# Patient Record
Sex: Female | Born: 1955 | Race: White | Hispanic: No | Marital: Married | Smoking: Never smoker
Health system: Southern US, Community
[De-identification: ages and names within clinical notes are randomized; demographics above are authoritative.]

## PROBLEM LIST (undated history)

## (undated) DIAGNOSIS — T7840XA Allergy, unspecified, initial encounter: Secondary | ICD-10-CM

## (undated) DIAGNOSIS — E119 Type 2 diabetes mellitus without complications: Secondary | ICD-10-CM

## (undated) DIAGNOSIS — I10 Essential (primary) hypertension: Secondary | ICD-10-CM

## (undated) DIAGNOSIS — J302 Other seasonal allergic rhinitis: Secondary | ICD-10-CM

## (undated) DIAGNOSIS — E785 Hyperlipidemia, unspecified: Secondary | ICD-10-CM

## (undated) HISTORY — DX: Hyperlipidemia, unspecified: E78.5

## (undated) HISTORY — PX: BREAST LUMPECTOMY: SHX2

## (undated) HISTORY — PX: UTERINE FIBROID SURGERY: SHX826

## (undated) HISTORY — PX: BREAST CYST ASPIRATION: SHX578

## (undated) HISTORY — PX: BREAST BIOPSY: SHX20

## (undated) HISTORY — DX: Other seasonal allergic rhinitis: J30.2

## (undated) HISTORY — PX: TONSILLECTOMY: SUR1361

## (undated) HISTORY — DX: Allergy, unspecified, initial encounter: T78.40XA

## (undated) HISTORY — PX: BREAST EXCISIONAL BIOPSY: SUR124

---

## 2017-01-25 ENCOUNTER — Encounter: Payer: Self-pay | Admitting: Primary Care

## 2017-01-25 ENCOUNTER — Ambulatory Visit (INDEPENDENT_AMBULATORY_CARE_PROVIDER_SITE_OTHER): Payer: 59 | Admitting: Primary Care

## 2017-01-25 ENCOUNTER — Other Ambulatory Visit (HOSPITAL_COMMUNITY)
Admission: RE | Admit: 2017-01-25 | Discharge: 2017-01-25 | Disposition: A | Discharge status: Home / Self Care | Payer: 59 | Source: Ambulatory Visit | Attending: Primary Care | Admitting: Primary Care

## 2017-01-25 VITALS — BP 146/94 | HR 100 | Temp 98.2°F | Ht 63.0 in | Wt 200.0 lb

## 2017-01-25 DIAGNOSIS — Z01419 Encounter for gynecological examination (general) (routine) without abnormal findings: Secondary | ICD-10-CM | POA: Insufficient documentation

## 2017-01-25 DIAGNOSIS — Z1231 Encounter for screening mammogram for malignant neoplasm of breast: Secondary | ICD-10-CM | POA: Diagnosis not present

## 2017-01-25 DIAGNOSIS — E1165 Type 2 diabetes mellitus with hyperglycemia: Secondary | ICD-10-CM | POA: Insufficient documentation

## 2017-01-25 DIAGNOSIS — E119 Type 2 diabetes mellitus without complications: Secondary | ICD-10-CM | POA: Insufficient documentation

## 2017-01-25 DIAGNOSIS — R739 Hyperglycemia, unspecified: Secondary | ICD-10-CM

## 2017-01-25 DIAGNOSIS — Z1151 Encounter for screening for human papillomavirus (HPV): Secondary | ICD-10-CM | POA: Insufficient documentation

## 2017-01-25 DIAGNOSIS — E785 Hyperlipidemia, unspecified: Secondary | ICD-10-CM | POA: Diagnosis not present

## 2017-01-25 DIAGNOSIS — Z1211 Encounter for screening for malignant neoplasm of colon: Secondary | ICD-10-CM

## 2017-01-25 DIAGNOSIS — Z1239 Encounter for other screening for malignant neoplasm of breast: Secondary | ICD-10-CM

## 2017-01-25 DIAGNOSIS — R030 Elevated blood-pressure reading, without diagnosis of hypertension: Secondary | ICD-10-CM

## 2017-01-25 DIAGNOSIS — I10 Essential (primary) hypertension: Secondary | ICD-10-CM | POA: Insufficient documentation

## 2017-01-25 DIAGNOSIS — Z124 Encounter for screening for malignant neoplasm of cervix: Secondary | ICD-10-CM

## 2017-01-25 DIAGNOSIS — Z0000 Encounter for general adult medical examination without abnormal findings: Secondary | ICD-10-CM

## 2017-01-25 NOTE — Progress Notes (Addendum)
Subjective:    Patient ID: Melissa Baldwin, female    DOB: [birth year], 61 y.o.   MRN: [id number]  HPI  Melissa Baldwin is a 61 year old female who presents today to establish care and for complete physical. Will obtain old records. Her last physical was 2 years ago.   1) Elevated Blood Pressure Reading: BP of 146/94 today in the office. She does have a history of intermittent elevated readings. She does not exercise and does not eat a healthy diet. She does not check her BP regularly, her last check was 1 year ago.    Immunizations: -Tetanus: Completed 5 years ago.  -Influenza: Declines -Shingles: Declines  Diet: She endorses a fair diet. Breakfast: Oatmeal, cereal Lunch: Soup, sandwich  Dinner: Meat, soups, salads, vegetables Snacks: None Desserts: Occasionally Beverages: Water, orange juice, sweet tea, occasional soda  Exercise: She does not currently exercise.  Eye exam: Completed 3-4 years ago. Dental exam: Completes semi-annually, due. Colonoscopy: Completed 7 years ago. Due now. Pap Smear: Completed 3 years ago.  Mammogram: Completed 2 years ago.   Review of Systems  Constitutional: Negative for unexpected weight change.  HENT: Negative for rhinorrhea.   Respiratory: Negative for cough and shortness of breath.   Cardiovascular: Negative for chest pain.  Gastrointestinal: Negative for constipation and diarrhea.  Genitourinary: Negative for difficulty urinating and menstrual problem.  Musculoskeletal: Negative for arthralgias and myalgias.  Skin: Negative for rash.  Allergic/Immunologic: Negative for environmental allergies.  Neurological: Negative for dizziness, numbness and headaches.  Psychiatric/Behavioral:       Denies concerns for anxiety or depression       Past Medical History:  Diagnosis Date  . Hyperlipidemia   . Seasonal allergies      Social History   Social History  . Marital status: Married    Spouse name: N/A  . Number of children: N/A    . Years of education: N/A   Occupational History  . Not on file.   Social History Main Topics  . Smoking status: Never Smoker  . Smokeless tobacco: Never Used  . Alcohol use Yes  . Drug use: Unknown  . Sexual activity: Not on file   Other Topics Concern  . Not on file   Social History Narrative   Married.   1 child.   Moved from [facility]   Works as a Art therapist.    Past Surgical History:  Procedure Laterality Date  . BREAST LUMPECTOMY     fibrocystic  . UTERINE FIBROID SURGERY      Family History  Problem Relation Age of Onset  . Alzheimer's disease Mother   . Diabetes Paternal Grandfather     Allergies  Allergen Reactions  . Penicillins     No current outpatient prescriptions on file prior to visit.   No current facility-administered medications on file prior to visit.     BP (!) 146/94   Pulse 100   Temp 98.2 F (36.8 C) (Oral)   Ht 5\' 3"  (1.6 m)   Wt 200 lb (90.7 kg)   SpO2 99%   BMI 35.43 kg/m    Objective:   Physical Exam  Constitutional: She is oriented to person, place, and time. She appears well-nourished.  HENT:  Right Ear: Tympanic membrane and ear canal normal.  Left Ear: Tympanic membrane and ear canal normal.  Nose: Nose normal.  Mouth/Throat: Oropharynx is clear and moist.  Eyes: Conjunctivae and EOM are normal. Pupils are equal, round, and reactive to light.  Neck: Neck supple. No thyromegaly present.  Cardiovascular: Normal rate and regular rhythm.   No murmur heard. Pulmonary/Chest: Effort normal and breath sounds normal. She has no rales.  Abdominal: Soft. Bowel sounds are normal. There is no tenderness.  Genitourinary: There is no tenderness or lesion on the right labia. There is no tenderness or lesion on the left labia. Cervix exhibits no motion tenderness and no discharge. Right adnexum displays no tenderness. Left adnexum displays no tenderness. No vaginal discharge found.  Musculoskeletal: Normal range of motion.   Lymphadenopathy:    She has no cervical adenopathy.  Neurological: She is alert and oriented to person, place, and time. She has normal reflexes. No cranial nerve deficit.  Skin: Skin is warm and dry. No rash noted.  Psychiatric: She has a normal mood and affect.          Assessment & Plan:

## 2017-01-25 NOTE — Patient Instructions (Addendum)
We will notify you of your Pap results once received.  Schedule a lab only appointment to return fasting for your labs. We will receive those results 24 hours after collected and will call with the results.  It's important to improve your diet by reducing consumption, processed snack foods, sugary drinks. Increase consumption of fresh vegetables and fruits, whole grains, water.  Ensure you are drinking 64 ounces of water daily.  Start exercising. You should be getting 150 minutes of moderate intensity exercise weekly.  Call the [facility] to schedule your mammogram.  You will be contacted regarding your referral to GI for the colonoscopy.  Please let us know if you have not heard back within one week.   Follow up in 1 year for your annual exam or sooner if needed.  It was a pleasure to meet you today! Please don't hesitate to call me with any questions. Welcome to [facility]!

## 2017-01-25 NOTE — Progress Notes (Signed)
Pre visit review using our clinic review tool, if applicable. No additional management support is needed unless otherwise documented below in the visit note. 

## 2017-01-25 NOTE — Assessment & Plan Note (Signed)
History of, once managed on Zocor. Check lipids today.  Discussed the importance of a healthy diet and regular exercise in order for weight loss, and to reduce the risk of other medical diseases.

## 2017-01-25 NOTE — Assessment & Plan Note (Signed)
Tetanus UTD, declines influenza and Zostavax. Pap due, completed and pending. Mammogram due, ordered. Colonoscopy due, ordered. Exam unremarkable. Labs pending. Discussed the importance of a healthy diet and regular exercise in order for weight loss, and to reduce the risk of other medical diseases. Follow up in 1 year for annual exam.

## 2017-01-25 NOTE — Assessment & Plan Note (Signed)
Noted on labs from prior PCP visit. Check A1C. Discussed to work on diet and exercise.

## 2017-01-25 NOTE — Assessment & Plan Note (Addendum)
Slightly above goal today, also history of elevated readings. Will have her monitor BP and notify if above 140/90. Work on [employer], healthy lifestyle changes.

## 2017-01-26 LAB — CYTOLOGY - PAP
DIAGNOSIS: NEGATIVE
HPV: NOT DETECTED

## 2017-01-27 ENCOUNTER — Other Ambulatory Visit: Payer: 59

## 2017-01-27 ENCOUNTER — Encounter: Payer: Self-pay | Admitting: *Deleted

## 2017-02-08 ENCOUNTER — Other Ambulatory Visit: Payer: 59

## 2017-02-09 ENCOUNTER — Other Ambulatory Visit: Payer: Self-pay

## 2017-02-09 ENCOUNTER — Telehealth: Payer: Self-pay

## 2017-02-09 DIAGNOSIS — Z1211 Encounter for screening for malignant neoplasm of colon: Secondary | ICD-10-CM

## 2017-02-09 NOTE — Telephone Encounter (Signed)
Gastroenterology Pre-Procedure Review  Request Date: 02/21/17 Requesting Physician: Dr. Vicente Males  PATIENT REVIEW QUESTIONS: The patient responded to the following health history questions as indicated:    1. Are you having any GI issues? no 2. Do you have a personal history of Polyps? no 3. Do you have a family history of Colon Cancer or Polyps? no 4. Diabetes Mellitus? no 5. Joint replacements in the past 12 months?no 6. Major health problems in the past 3 months?no 7. Any artificial heart valves, MVP, or defibrillator?no    MEDICATIONS & ALLERGIES:    Patient reports the following regarding taking any anticoagulation/antiplatelet therapy:   Plavix, Coumadin, Eliquis, Xarelto, Lovenox, Pradaxa, Brilinta, or Effient? no Aspirin? no  Patient confirms/reports the following medications:  No current outpatient prescriptions on file.   No current facility-administered medications for this visit.     Patient confirms/reports the following allergies:  Allergies  Allergen Reactions  . Penicillins     No orders of the defined types were placed in this encounter.   AUTHORIZATION INFORMATION Primary Insurance: 1D#: Group #:  Secondary Insurance: 1D#: Group #:  SCHEDULE INFORMATION: Date: 02/21/17 Time: Location: [place]

## 2017-02-13 ENCOUNTER — Other Ambulatory Visit (INDEPENDENT_AMBULATORY_CARE_PROVIDER_SITE_OTHER): Payer: 59

## 2017-02-13 DIAGNOSIS — R739 Hyperglycemia, unspecified: Secondary | ICD-10-CM

## 2017-02-13 DIAGNOSIS — E785 Hyperlipidemia, unspecified: Secondary | ICD-10-CM | POA: Diagnosis not present

## 2017-02-13 LAB — COMPREHENSIVE METABOLIC PANEL
ALT: 56 U/L — ABNORMAL HIGH (ref 0–35)
AST: 30 U/L (ref 0–37)
Albumin: 4.4 g/dL (ref 3.5–5.2)
Alkaline Phosphatase: 83 U/L (ref 39–117)
BILIRUBIN TOTAL: 0.7 mg/dL (ref 0.2–1.2)
BUN: 13 mg/dL (ref 6–23)
CALCIUM: 9.9 mg/dL (ref 8.4–10.5)
CHLORIDE: 101 meq/L (ref 96–112)
CO2: 28 meq/L (ref 19–32)
Creatinine, Ser: 0.68 mg/dL (ref 0.40–1.20)
GFR: 93.58 mL/min (ref >60.00)
Glucose, Bld: 187 mg/dL — ABNORMAL HIGH (ref 70–99)
POTASSIUM: 4.4 meq/L (ref 3.5–5.1)
Sodium: 138 mEq/L (ref 135–145)
Total Protein: 7.6 g/dL (ref 6.0–8.3)

## 2017-02-13 LAB — HEMOGLOBIN A1C: Hgb A1c MFr Bld: 8.2 % — ABNORMAL HIGH (ref 4.6–6.5)

## 2017-02-13 LAB — LIPID PANEL
CHOL/HDL RATIO: 6
Cholesterol: 322 mg/dL — ABNORMAL HIGH (ref 0–200)
HDL: 56.2 mg/dL (ref >39.00)
NONHDL: 265.89
TRIGLYCERIDES: 250 mg/dL — AB (ref 0.0–149.0)
VLDL: 50 mg/dL — AB (ref 0.0–40.0)

## 2017-02-13 LAB — LDL CHOLESTEROL, DIRECT: LDL DIRECT: 210 mg/dL

## 2017-02-14 ENCOUNTER — Telehealth: Payer: Self-pay | Admitting: Gastroenterology

## 2017-02-14 NOTE — Telephone Encounter (Signed)
Patient called to cancel her colonoscopy due to helping parents out of state. She will call us back to reschedule. I called [facility] to let them know.

## 2017-02-15 ENCOUNTER — Other Ambulatory Visit: Payer: Self-pay | Admitting: Primary Care

## 2017-02-15 DIAGNOSIS — E119 Type 2 diabetes mellitus without complications: Secondary | ICD-10-CM

## 2017-02-15 DIAGNOSIS — E785 Hyperlipidemia, unspecified: Secondary | ICD-10-CM

## 2017-02-15 MED ORDER — ATORVASTATIN CALCIUM 40 MG PO TABS: 40.0000 mg | ORAL_TABLET | Freq: Every evening | ORAL | 1 refills | Status: DC

## 2017-02-15 MED ORDER — METFORMIN HCL 500 MG PO TABS: ORAL_TABLET | ORAL | 0 refills | Status: DC

## 2017-02-16 ENCOUNTER — Telehealth: Payer: Self-pay

## 2017-02-16 NOTE — Telephone Encounter (Signed)
Okay to delay treatment for two weeks, would recommend she start as soon as she gets back. No interaction with Metformin, atorvastatin, Claritin.

## 2017-02-16 NOTE — Telephone Encounter (Signed)
Pt will be flying for 2 hours this weekend and will be out of town for 2 weeks; one week dealing with issues for her parents out of state and the other week a vacation with other family members. Pt wanted a review of possible side effects of Metformin and advised pt some of the more common side effects with metformin are bloating, stomach pain, diarrhea, bloating and or gas, constipation and heartburn. Pt voiced understanding and wants to know if it is OK to wait 2 weeks before starting metformin and are there any potential interactions between Claritin (which pt takes this med) and Metformin or atorvastatin. Pt request cb.

## 2017-02-16 NOTE — Telephone Encounter (Signed)
Spoken and notified patient of Kate's comments. Patient verbalized understanding. 

## 2017-02-21 ENCOUNTER — Ambulatory Visit: Admit: 2017-02-21 | Payer: 59 | Admitting: Gastroenterology

## 2017-02-21 SURGERY — COLONOSCOPY WITH PROPOFOL
Anesthesia: General

## 2017-03-12 LAB — HM DIABETES EYE EXAM

## 2017-03-27 ENCOUNTER — Telehealth: Payer: Self-pay

## 2017-03-27 ENCOUNTER — Other Ambulatory Visit: Payer: Self-pay

## 2017-03-27 DIAGNOSIS — Z86010 Personal history of colonic polyps: Secondary | ICD-10-CM

## 2017-03-27 NOTE — Telephone Encounter (Signed)
Patient states no documentation necessary. She has previous documents and prescription.     Gastroenterology Pre-Procedure Review  Request Date: 4/26 Requesting Physician: Dr. Vicente Males  PATIENT REVIEW QUESTIONS: The patient responded to the following health history questions as indicated:    1. Are you having any GI issues? no 2. Do you have a personal history of Polyps? yes (removed) 3. Do you have a family history of Colon Cancer or Polyps? no 4. Diabetes Mellitus? yes (Type II) 5. Joint replacements in the past 12 months?no 6. Major health problems in the past 3 months?no 7. Any artificial heart valves, MVP, or defibrillator?no    MEDICATIONS & ALLERGIES:    Patient reports the following regarding taking any anticoagulation/antiplatelet therapy:   Plavix, Coumadin, Eliquis, Xarelto, Lovenox, Pradaxa, Brilinta, or Effient? no Aspirin? no  Patient confirms/reports the following medications:  Current Outpatient Prescriptions  Medication Sig Dispense Refill  . atorvastatin (LIPITOR) 40 MG tablet Take 1 tablet (40 mg total) by mouth every evening. 90 tablet 1  . metFORMIN (GLUCOPHAGE) 500 MG tablet Take 1 tablet by mouth every morning with food for 2 weeks, then take 1 tablet by mouth twice daily with food thereafter. 180 tablet 0   No current facility-administered medications for this visit.     Patient confirms/reports the following allergies:  Allergies  Allergen Reactions  . Penicillins     No orders of the defined types were placed in this encounter.   AUTHORIZATION INFORMATION Primary Insurance: 1D#: Group #:  Secondary Insurance: 1D#: Group #:  SCHEDULE INFORMATION: Date: 4/26 Time: Location: [place]

## 2017-03-29 ENCOUNTER — Other Ambulatory Visit: Payer: Self-pay

## 2017-03-29 DIAGNOSIS — Z1211 Encounter for screening for malignant neoplasm of colon: Secondary | ICD-10-CM

## 2017-03-29 DIAGNOSIS — Z1212 Encounter for screening for malignant neoplasm of rectum: Principal | ICD-10-CM

## 2017-03-29 MED ORDER — NA SULFATE-K SULFATE-MG SULF 17.5-3.13-1.6 GM/177ML PO SOLN: 1.0000 | Freq: Once | ORAL | 0 refills | Status: AC

## 2017-04-11 ENCOUNTER — Ambulatory Visit: Payer: 59

## 2017-04-18 ENCOUNTER — Ambulatory Visit
Admission: RE | Admit: 2017-04-18 | Discharge: 2017-04-18 | Disposition: A | Discharge status: Home / Self Care | Payer: 59 | Source: Ambulatory Visit | Attending: Primary Care | Admitting: Primary Care

## 2017-04-18 DIAGNOSIS — Z1239 Encounter for other screening for malignant neoplasm of breast: Secondary | ICD-10-CM

## 2017-04-18 DIAGNOSIS — Z1231 Encounter for screening mammogram for malignant neoplasm of breast: Secondary | ICD-10-CM | POA: Diagnosis present

## 2017-04-19 ENCOUNTER — Encounter: Payer: Self-pay | Admitting: *Deleted

## 2017-04-19 ENCOUNTER — Telehealth: Payer: Self-pay | Admitting: Gastroenterology

## 2017-04-19 NOTE — Telephone Encounter (Signed)
04/19/17 Spoke with Melissa Baldwin at [facility] for Screening Colonoscopy [place] / Z86.010 & Z12.11 Case #: [id number] Ref#: [id number]. Per [facility] must submit clinicals to fax # [phone].

## 2017-04-25 ENCOUNTER — Inpatient Hospital Stay
Admission: RE | Admit: 2017-04-25 | Discharge: 2017-04-25 | Disposition: A | Discharge status: Home / Self Care | Payer: Self-pay | Source: Ambulatory Visit | Attending: *Deleted | Admitting: *Deleted

## 2017-04-25 ENCOUNTER — Other Ambulatory Visit: Payer: Self-pay | Admitting: *Deleted

## 2017-04-25 DIAGNOSIS — Z9289 Personal history of other medical treatment: Secondary | ICD-10-CM

## 2017-04-26 ENCOUNTER — Encounter: Payer: Self-pay | Admitting: *Deleted

## 2017-04-27 ENCOUNTER — Encounter
Admission: RE | Disposition: A | Discharge status: Home / Self Care | Payer: Self-pay | Source: Ambulatory Visit | Attending: Gastroenterology

## 2017-04-27 ENCOUNTER — Ambulatory Visit
Admission: RE | Admit: 2017-04-27 | Discharge: 2017-04-27 | Disposition: A | Discharge status: Home / Self Care | Payer: 59 | Source: Ambulatory Visit | Attending: Gastroenterology | Admitting: Gastroenterology

## 2017-04-27 ENCOUNTER — Encounter: Payer: Self-pay | Admitting: *Deleted

## 2017-04-27 ENCOUNTER — Ambulatory Visit: Payer: 59 | Admitting: Certified Registered Nurse Anesthetist

## 2017-04-27 DIAGNOSIS — Z1211 Encounter for screening for malignant neoplasm of colon: Secondary | ICD-10-CM | POA: Diagnosis present

## 2017-04-27 DIAGNOSIS — D123 Benign neoplasm of transverse colon: Secondary | ICD-10-CM | POA: Insufficient documentation

## 2017-04-27 DIAGNOSIS — E119 Type 2 diabetes mellitus without complications: Secondary | ICD-10-CM | POA: Insufficient documentation

## 2017-04-27 DIAGNOSIS — Z860100 Personal history of colon polyps, unspecified: Secondary | ICD-10-CM

## 2017-04-27 DIAGNOSIS — Z7984 Long term (current) use of oral hypoglycemic drugs: Secondary | ICD-10-CM | POA: Diagnosis not present

## 2017-04-27 DIAGNOSIS — E785 Hyperlipidemia, unspecified: Secondary | ICD-10-CM | POA: Diagnosis not present

## 2017-04-27 DIAGNOSIS — K640 First degree hemorrhoids: Secondary | ICD-10-CM | POA: Diagnosis not present

## 2017-04-27 DIAGNOSIS — K5730 Diverticulosis of large intestine without perforation or abscess without bleeding: Secondary | ICD-10-CM

## 2017-04-27 DIAGNOSIS — Z79899 Other long term (current) drug therapy: Secondary | ICD-10-CM | POA: Insufficient documentation

## 2017-04-27 HISTORY — PX: COLONOSCOPY WITH PROPOFOL: SHX5780

## 2017-04-27 HISTORY — DX: Type 2 diabetes mellitus without complications: E11.9

## 2017-04-27 LAB — GLUCOSE, CAPILLARY: Glucose-Capillary: 123 mg/dL — ABNORMAL HIGH (ref 65–99)

## 2017-04-27 SURGERY — COLONOSCOPY WITH PROPOFOL
Anesthesia: General

## 2017-04-27 MED ORDER — SODIUM CHLORIDE 0.9 % IV SOLN
INTRAVENOUS | Status: DC
  Administered 2017-04-27: 07:00:00 via INTRAVENOUS

## 2017-04-27 MED ORDER — PROPOFOL 500 MG/50ML IV EMUL
INTRAVENOUS | Status: DC | PRN
  Administered 2017-04-27: 130 ug/kg/min via INTRAVENOUS

## 2017-04-27 MED ORDER — PROPOFOL 500 MG/50ML IV EMUL
INTRAVENOUS | Status: AC
  Filled 2017-04-27: qty 50

## 2017-04-27 MED ORDER — PROPOFOL 10 MG/ML IV BOLUS
INTRAVENOUS | Status: DC | PRN
  Administered 2017-04-27: 80 mg via INTRAVENOUS

## 2017-04-27 MED ORDER — LIDOCAINE HCL 2 % IJ SOLN
INTRAMUSCULAR | Status: AC
  Filled 2017-04-27: qty 10

## 2017-04-27 NOTE — Anesthesia Post-op Follow-up Note (Cosign Needed)
Anesthesia QCDR form completed.        

## 2017-04-27 NOTE — H&P (Signed)
  Jonathon Bellows MD [address], [place], [place] [address] Phone: [phone] Fax : [phone]  Primary Care Physician:  Pleas Koch, NP Primary Gastroenterologist:  Dr. Jonathon Bellows   Pre-Procedure History & Physical: HPI:  Melissa Baldwin is a 61 y.o. female is here for an colonoscopy.   Past Medical History:  Diagnosis Date  . Diabetes mellitus without complication ([place])   . Hyperlipidemia   . Seasonal allergies     Past Surgical History:  Procedure Laterality Date  . BREAST LUMPECTOMY Right    fibrocystic ? early 90's  . TONSILLECTOMY    . UTERINE FIBROID SURGERY      Prior to Admission medications   Medication Sig Start Date End Date Taking? Authorizing Provider  atorvastatin (LIPITOR) 40 MG tablet Take 1 tablet (40 mg total) by mouth every evening. 02/15/17   Pleas Koch, NP  metFORMIN (GLUCOPHAGE) 500 MG tablet Take 1 tablet by mouth every morning with food for 2 weeks, then take 1 tablet by mouth twice daily with food thereafter. 02/15/17   Pleas Koch, NP    Allergies as of 03/27/2017 - Review Complete 01/25/2017  Allergen Reaction Noted  . Penicillins  01/25/2017    Family History  Problem Relation Age of Onset  . Alzheimer's disease Mother   . Diabetes Paternal Grandfather     Social History   Social History  . Marital status: Married    Spouse name: N/A  . Number of children: N/A  . Years of education: N/A   Occupational History  . Not on file.   Social History Main Topics  . Smoking status: Never Smoker  . Smokeless tobacco: Never Used  . Alcohol use Yes  . Drug use: No  . Sexual activity: Not on file   Other Topics Concern  . Not on file   Social History Narrative   Married.   1 child.   Moved from [facility]   Works as a Art therapist.    Review of Systems: See HPI, otherwise negative ROS  Physical Exam: BP (!) 159/87   Pulse 95   Temp (!) 96.5 F (35.8 C) (Tympanic)   Resp 18   Ht 5\' 3"  (1.6 m)    Wt 187 lb (84.8 kg)   SpO2 100%   BMI 33.13 kg/m  General:   Alert,  pleasant and cooperative in NAD Head:  Normocephalic and atraumatic. Neck:  Supple; no masses or thyromegaly. Lungs:  Clear throughout to auscultation.    Heart:  Regular rate and rhythm. Abdomen:  Soft, nontender and nondistended. Normal bowel sounds, without guarding, and without rebound.   Neurologic:  Alert and  oriented x4;  grossly normal neurologically.  Impression/Plan: Melissa Baldwin is here for an colonoscopy to be performed for surveillance due to prior history of colon polyps   Risks, benefits, limitations, and alternatives regarding  colonoscopy have been reviewed with the patient.  Questions have been answered.  All parties agreeable.   Jonathon Bellows, MD  04/27/2017, 7:38 AM

## 2017-04-27 NOTE — Op Note (Signed)
[facility] Gastroenterology Patient Name: Melissa Baldwin Procedure Date: 04/27/2017 7:38 AM MRN: [id number] Account #: [id number] Date of Birth: Jan 14, 1956 Admit Type: Outpatient Age: 61 Room: [facility] ENDO ROOM 4 Gender: Female Note Status: Finalized Procedure:            Colonoscopy Indications:          High risk colon cancer surveillance: Personal history                        of colonic polyps Providers:            Jonathon Bellows MD, MD Referring MD:         No Local Md, MD (Referring MD) Medicines:            Monitored Anesthesia Care Complications:        No immediate complications. Procedure:            Pre-Anesthesia Assessment:                       - Prior to the procedure, a History and Physical was                        performed, and patient medications, allergies and                        sensitivities were reviewed. The patient's tolerance of                        previous anesthesia was reviewed.                       - The risks and benefits of the procedure and the                        sedation options and risks were discussed with the                        patient. All questions were answered and informed                        consent was obtained.                       - ASA Grade Assessment: II - A patient with mild                        systemic disease.                       After obtaining informed consent, the colonoscope was                        passed under direct vision. Throughout the procedure,                        the patient's blood pressure, pulse, and oxygen                        saturations were monitored continuously. The  Colonoscope was introduced through the anus and                        advanced to the the cecum, identified by the                        appendiceal orifice, IC valve and transillumination.                        The colonoscopy was performed with ease. The patient                  tolerated the procedure well. The quality of the bowel                        preparation was adequate. Findings:      The perianal and digital rectal examinations were normal.      A 5 mm polyp was found in the transverse colon. The polyp was sessile.       The polyp was removed with a cold snare. Resection and retrieval were       complete.      Multiple medium-mouthed diverticula were found in the sigmoid colon.      Non-bleeding internal hemorrhoids were found during retroflexion. The       hemorrhoids were small and Grade I (internal hemorrhoids that do not       prolapse).      The exam was otherwise without abnormality on direct and retroflexion       views. Impression:           - One 5 mm polyp in the transverse colon, removed with                        a cold snare. Resected and retrieved.                       - Diverticulosis in the sigmoid colon.                       - Non-bleeding internal hemorrhoids.                       - The examination was otherwise normal on direct and                        retroflexion views. Recommendation:       - Discharge patient to home (with escort).                       - Resume previous diet.                       - Continue present medications.                       - Await pathology results.                       - Repeat colonoscopy in 5 years for surveillance. Procedure Code(s):    --- Professional ---                       [phone], Colonoscopy, flexible; with removal of  tumor(s),                        polyp(s), or other lesion(s) by snare technique Diagnosis Code(s):    --- Professional ---                       Z86.010, Personal history of colonic polyps                       D12.3, Benign neoplasm of transverse colon (hepatic                        flexure or splenic flexure)                       K64.0, First degree hemorrhoids                       K57.30, Diverticulosis of large intestine without                         perforation or abscess without bleeding CPT copyright 2016 American Medical Association. All rights reserved. The codes documented in this report are preliminary and upon coder review may  be revised to meet current compliance requirements. Jonathon Bellows, MD Jonathon Bellows MD, MD 04/27/2017 8:02:07 AM This report has been signed electronically. Number of Addenda: 0 Note Initiated On: 04/27/2017 7:38 AM Scope Withdrawal Time: 0 hours 14 minutes 12 seconds  Total Procedure Duration: 0 hours 17 minutes 36 seconds       [facility]

## 2017-04-27 NOTE — Transfer of Care (Signed)
Immediate Anesthesia Transfer of Care Note  Patient: Melissa Baldwin  Procedure(s) Performed: Procedure(s): COLONOSCOPY WITH PROPOFOL (N/A)  Patient Location: PACU  Anesthesia Type:General  Level of Consciousness: awake and alert   Airway & Oxygen Therapy: Patient Spontanous Breathing and Patient connected to nasal cannula oxygen  Post-op Assessment: Report given to RN and Post -op Vital signs reviewed and stable  Post vital signs: Reviewed and stable  Last Vitals:  Vitals:   04/27/17 0650  BP: (!) 159/87  Pulse: 95  Resp: 18  Temp: (!) 35.8 C    Last Pain:  Vitals:   04/27/17 0650  TempSrc: Tympanic         Complications: No apparent anesthesia complications

## 2017-04-27 NOTE — Anesthesia Procedure Notes (Signed)
Performed by: Nattalie Santiesteban, Madelyn Pre-anesthesia Checklist: Patient identified, Emergency Drugs available, Suction available, Patient being monitored and Timeout performed Patient Re-evaluated:Patient Re-evaluated prior to inductionOxygen Delivery Method: Nasal cannula Intubation Type: IV induction       

## 2017-04-27 NOTE — Anesthesia Postprocedure Evaluation (Signed)
Anesthesia Post Note  Patient: Melissa Baldwin  Procedure(s) Performed: Procedure(s) (LRB): COLONOSCOPY WITH PROPOFOL (N/A)  Patient location during evaluation: Endoscopy Anesthesia Type: General Level of consciousness: awake and alert and oriented Pain management: pain level controlled Vital Signs Assessment: post-procedure vital signs reviewed and stable Respiratory status: spontaneous breathing, nonlabored ventilation and respiratory function stable Cardiovascular status: blood pressure returned to baseline and stable Postop Assessment: no signs of nausea or vomiting Anesthetic complications: no     Last Vitals:  Vitals:   04/27/17 0810 04/27/17 0820  BP: 140/80 (!) 132/92  Pulse: 80 69  Resp: 14 13  Temp:      Last Pain:  Vitals:   04/27/17 0800  TempSrc: Tympanic                 Mercer Stallworth

## 2017-04-27 NOTE — Anesthesia Preprocedure Evaluation (Signed)
Anesthesia Evaluation  Patient identified by MRN, date of birth, ID band Patient awake    Reviewed: Allergy & Precautions, NPO status , Patient's Chart, lab work & pertinent test results  History of Anesthesia Complications Negative for: history of anesthetic complications  Airway Mallampati: II  TM Distance: >3 FB Neck ROM: Full    Dental no notable dental hx.    Pulmonary neg pulmonary ROS, neg sleep apnea, neg COPD,    breath sounds clear to auscultation- rhonchi (-) wheezing      Cardiovascular Exercise Tolerance: Good (-) hypertension(-) CAD and (-) Past MI  Rhythm:Regular Rate:Normal - Systolic murmurs and - Diastolic murmurs    Neuro/Psych negative neurological ROS  negative psych ROS   GI/Hepatic negative GI ROS, Neg liver ROS,   Endo/Other  diabetes, Oral Hypoglycemic Agents  Renal/GU negative Renal ROS     Musculoskeletal negative musculoskeletal ROS (+)   Abdominal (+) + obese,   Peds  Hematology negative hematology ROS (+)   Anesthesia Other Findings Past Medical History: No date: Diabetes mellitus without complication (HCC) No date: Hyperlipidemia No date: Seasonal allergies   Reproductive/Obstetrics                             Anesthesia Physical Anesthesia Plan  ASA: II  Anesthesia Plan: General   Post-op Pain Management:    Induction: Intravenous  Airway Management Planned: Natural Airway  Additional Equipment:   Intra-op Plan:   Post-operative Plan:   Informed Consent: I have reviewed the patients History and Physical, chart, labs and discussed the procedure including the risks, benefits and alternatives for the proposed anesthesia with the patient or authorized representative who has indicated his/her understanding and acceptance.   Dental advisory given  Plan Discussed with: CRNA and Anesthesiologist  Anesthesia Plan Comments:          Anesthesia Quick Evaluation

## 2017-04-28 LAB — SURGICAL PATHOLOGY

## 2017-05-08 ENCOUNTER — Encounter: Payer: Self-pay | Admitting: Gastroenterology

## 2017-05-15 ENCOUNTER — Other Ambulatory Visit: Payer: Self-pay | Admitting: Primary Care

## 2017-05-15 ENCOUNTER — Encounter: Payer: Self-pay | Admitting: Primary Care

## 2017-05-15 ENCOUNTER — Ambulatory Visit (INDEPENDENT_AMBULATORY_CARE_PROVIDER_SITE_OTHER): Payer: 59 | Admitting: Primary Care

## 2017-05-15 VITALS — BP 140/86 | HR 79 | Temp 97.5°F | Ht 63.0 in | Wt 187.1 lb

## 2017-05-15 DIAGNOSIS — I10 Essential (primary) hypertension: Secondary | ICD-10-CM | POA: Diagnosis not present

## 2017-05-15 DIAGNOSIS — E785 Hyperlipidemia, unspecified: Secondary | ICD-10-CM | POA: Diagnosis not present

## 2017-05-15 DIAGNOSIS — E119 Type 2 diabetes mellitus without complications: Secondary | ICD-10-CM | POA: Diagnosis not present

## 2017-05-15 LAB — LIPID PANEL
CHOL/HDL RATIO: 3
Cholesterol: 190 mg/dL (ref 0–200)
HDL: 57.6 mg/dL (ref >39.00)
LDL Cholesterol: 110 mg/dL — ABNORMAL HIGH (ref 0–99)
NONHDL: 132.73
Triglycerides: 113 mg/dL (ref 0.0–149.0)
VLDL: 22.6 mg/dL (ref 0.0–40.0)

## 2017-05-15 LAB — COMPREHENSIVE METABOLIC PANEL
ALT: 34 U/L (ref 0–35)
AST: 22 U/L (ref 0–37)
Albumin: 4.8 g/dL (ref 3.5–5.2)
Alkaline Phosphatase: 90 U/L (ref 39–117)
BILIRUBIN TOTAL: 0.9 mg/dL (ref 0.2–1.2)
BUN: 17 mg/dL (ref 6–23)
CO2: 29 meq/L (ref 19–32)
Calcium: 10.1 mg/dL (ref 8.4–10.5)
Chloride: 104 mEq/L (ref 96–112)
Creatinine, Ser: 0.69 mg/dL (ref 0.40–1.20)
GFR: 91.94 mL/min (ref >60.00)
GLUCOSE: 159 mg/dL — AB (ref 70–99)
POTASSIUM: 4.5 meq/L (ref 3.5–5.1)
SODIUM: 139 meq/L (ref 135–145)
Total Protein: 8 g/dL (ref 6.0–8.3)

## 2017-05-15 LAB — MICROALBUMIN / CREATININE URINE RATIO
Creatinine,U: 131.6 mg/dL
Microalb Creat Ratio: 0.8 mg/g (ref 0.0–30.0)
Microalb, Ur: 1.1 mg/dL (ref 0.0–1.9)

## 2017-05-15 LAB — HEMOGLOBIN A1C: Hgb A1c MFr Bld: 7.8 % — ABNORMAL HIGH (ref 4.6–6.5)

## 2017-05-15 MED ORDER — LISINOPRIL 10 MG PO TABS: 10.0000 mg | ORAL_TABLET | Freq: Every day | ORAL | 0 refills | Status: DC

## 2017-05-15 NOTE — Assessment & Plan Note (Signed)
Now with 2 documented elevated blood pressure readings. Blood pressure reading above goal at various drugstores. Will initiate within about 10 mg for hypertension and also for renal protection given diabetes. We'll call her in 2 weeks for blood pressure readings, BMP at that time.

## 2017-05-15 NOTE — Patient Instructions (Signed)
Complete lab work prior to leaving today.   Start Lisinopril 10 mg tablets for high blood pressure and kidney protection Take 1 tablet by mouth once daily.   Check your blood pressure daily, around the same time of day, for the next 2 weeks.  Ensure that you have rested for 30 minutes prior to checking your blood pressure. Record your readings and I'll call you for those readings in 2 weeks.  Continue atorvastatin 40 mg tablets for cholesterol.  Start exercising. You should be getting 150 minutes of moderate intensity exercise weekly.  Continue to work on improvements in diet.  I'll be in touch with you soon regarding your test results.  It was a pleasure to see you today!   Type 2 Diabetes Mellitus, Diagnosis, Adult Type 2 diabetes (type 2 diabetes mellitus) is a long-term (chronic) disease. In type 2 diabetes, one or both of these problems may be present:  The pancreas does not make enough of a hormone called insulin.  Cells in the body do not respond properly to insulin that the body makes (insulin resistance).  Normally, insulin allows blood sugar (glucose) to enter cells in the body. The cells use glucose for energy. Insulin resistance or lack of insulin causes excess glucose to build up in the blood instead of going into cells. As a result, high blood glucose (hyperglycemia) develops. What increases the risk? The following factors may make you more likely to develop type 2 diabetes:  Having a family member with type 2 diabetes.  Being overweight or obese.  Having an inactive (sedentary) lifestyle.  Having been diagnosed with insulin resistance.  Having a history of prediabetes, gestational diabetes, or polycystic ovarian syndrome (PCOS).  Being of American-Indian, African-American, Hispanic/Latino, or Asian/Pacific Islander descent.  What are the signs or symptoms? In the early stage of this condition, you may not have symptoms. Symptoms develop slowly and may  include:  Increased thirst (polydipsia).  Increased hunger(polyphagia).  Increased urination (polyuria).  Increased urination during the night (nocturia).  Unexplained weight loss.  Frequent infections that keep coming back (recurring).  Fatigue.  Weakness.  Vision changes, such as blurry vision.  Cuts or bruises that are slow to heal.  Tingling or numbness in the hands or feet.  Dark patches on the skin (acanthosis nigricans).  How is this diagnosed?  This condition is diagnosed based on your symptoms, your medical history, a physical exam, and your blood glucose level. Your blood glucose may be checked with one or more of the following blood tests:  A fasting blood glucose (FBG) test. You will not be allowed to eat (you will fast) for at least 8 hours before a blood sample is taken.  A random blood glucose test. This checks blood glucose at any time of day regardless of when you ate.  An A1c (hemoglobin A1c) blood test. This provides information about blood glucose control over the previous 2-3 months.  An oral glucose tolerance test (OGTT). This measures your blood glucose at two times: ? After fasting. This is your baseline blood glucose level. ? Two hours after drinking a beverage that contains glucose.  You may be diagnosed with type 2 diabetes if:  Your FBG level is 126 mg/dL (7.0 mmol/L) or higher.  Your random blood glucose level is 200 mg/dL (11.1 mmol/L) or higher.  Your A1c level is 6.5% or higher.  Your OGGT result is higher than 200 mg/dL (11.1 mmol/L).  These blood tests may be repeated to confirm your diagnosis.  How is this treated?  Your treatment may be managed by a specialist called an endocrinologist. Type 2 diabetes may be treated by following instructions from your health care provider about:  Making diet and lifestyle changes. This may include: ? Following an individualized nutrition plan that is developed by a diet and nutrition  specialist (registered dietitian). ? Exercising regularly. ? Finding ways to manage stress.  Checking your blood glucose level as often as directed.  Taking diabetes medicines or insulin daily. This helps to keep your blood glucose levels in the healthy range. ? If you use insulin, you may need to adjust the dosage depending on how physically active you are and what foods you eat. Your health care provider will tell you how to adjust your dosage.  Taking medicines to help prevent complications from diabetes, such as: ? Aspirin. ? Medicine to lower cholesterol. ? Medicine to control blood pressure.  Your health care provider will set individualized treatment goals for you. Your goals will be based on your age, other medical conditions you have, and how you respond to diabetes treatment. Generally, the goal of treatment is to maintain the following blood glucose levels:  Before meals (preprandial): 80-130 mg/dL (4.4-7.2 mmol/L).  After meals (postprandial): below 180 mg/dL (10 mmol/L).  A1c level: less than 7%.  Follow these instructions at home: Questions to [facility] Provider Consider asking the following questions:  Do I need to meet with a diabetes educator?  Where can I find a support group for people with diabetes?  What equipment will I need to manage my diabetes at home?  What diabetes medicines do I need, and when should I take them?  How often do I need to check my blood glucose?  What number can I call if I have questions?  When is my next appointment?  General instructions  Take over-the-counter and prescription medicines only as told by your health care provider.  Keep all follow-up visits as told by your health care provider. This is important.  For more information about diabetes, visit: ? American Diabetes Association (ADA): www.diabetes.org ? American Association of Diabetes Educators (AADE): www.diabeteseducator.org/patient-resources Contact  a health care provider if:  Your blood glucose is at or above 240 mg/dL (13.3 mmol/L) for 2 days in a row.  You have been sick or have had a fever for 2 days or longer and you are not getting better.  You have any of the following problems for more than 6 hours: ? You cannot eat or drink. ? You have nausea and vomiting. ? You have diarrhea. Get help right away if:  Your blood glucose is lower than 54 mg/dL (3.0 mmol/L).  You become confused or you have trouble thinking clearly.  You have difficulty breathing.  You have moderate or large ketone levels in your urine. This information is not intended to replace advice given to you by your health care provider. Make sure you discuss any questions you have with your health care provider. Document Released: 11/28/2005 Document Revised: 05/05/2016 Document Reviewed: 01/01/2016 Elsevier Interactive Patient Education  2017 Elsevier Inc.   Diabetes Mellitus and Food It is important for you to manage your blood sugar (glucose) level. Your blood glucose level can be greatly affected by what you eat. Eating healthier foods in the appropriate amounts throughout the day at about the same time each day will help you control your blood glucose level. It can also help slow or prevent worsening of your diabetes mellitus. Healthy  eating may even help you improve the level of your blood pressure and reach or maintain a healthy weight. General recommendations for healthful eating and cooking habits include:  Eating meals and snacks regularly. Avoid going long periods of time without eating to lose weight.  Eating a diet that consists mainly of plant-based foods, such as fruits, vegetables, nuts, legumes, and whole grains.  Using low-heat cooking methods, such as baking, instead of high-heat cooking methods, such as deep frying.  Work with your dietitian to make sure you understand how to use the Nutrition Facts information on food labels. How can food  affect me? Carbohydrates Carbohydrates affect your blood glucose level more than any other type of food. Your dietitian will help you determine how many carbohydrates to eat at each meal and teach you how to count carbohydrates. Counting carbohydrates is important to keep your blood glucose at a healthy level, especially if you are using insulin or taking certain medicines for diabetes mellitus. Alcohol Alcohol can cause sudden decreases in blood glucose (hypoglycemia), especially if you use insulin or take certain medicines for diabetes mellitus. Hypoglycemia can be a life-threatening condition. Symptoms of hypoglycemia (sleepiness, dizziness, and disorientation) are similar to symptoms of having too much alcohol. If your health care provider has given you approval to drink alcohol, do so in moderation and use the following guidelines:  Women should not have more than one drink per day, and men should not have more than two drinks per day. One drink is equal to: ? 12 oz of beer. ? 5 oz of wine. ? 1 oz of hard liquor.  Do not drink on an empty stomach.  Keep yourself hydrated. Have water, diet soda, or unsweetened iced tea.  Regular soda, juice, and other mixers might contain a lot of carbohydrates and should be counted.  What foods are not recommended? As you make food choices, it is important to remember that all foods are not the same. Some foods have fewer nutrients per serving than other foods, even though they might have the same number of calories or carbohydrates. It is difficult to get your body what it needs when you eat foods with fewer nutrients. Examples of foods that you should avoid that are high in calories and carbohydrates but low in nutrients include:  Trans fats (most processed foods list trans fats on the Nutrition Facts label).  Regular soda.  Juice.  Candy.  Sweets, such as cake, pie, doughnuts, and cookies.  Fried foods.  What foods can I eat? Eat  nutrient-rich foods, which will nourish your body and keep you healthy. The food you should eat also will depend on several factors, including:  The calories you need.  The medicines you take.  Your weight.  Your blood glucose level.  Your blood pressure level.  Your cholesterol level.  You should eat a variety of foods, including:  Protein. ? Lean cuts of meat. ? Proteins low in saturated fats, such as fish, egg whites, and beans. Avoid processed meats.  Fruits and vegetables. ? Fruits and vegetables that may help control blood glucose levels, such as apples, mangoes, and yams.  Dairy products. ? Choose fat-free or low-fat dairy products, such as milk, yogurt, and cheese.  Grains, bread, pasta, and rice. ? Choose whole grain products, such as multigrain bread, whole oats, and brown rice. These foods may help control blood pressure.  Fats. ? Foods containing healthful fats, such as nuts, avocado, olive oil, canola oil, and fish.  Does  everyone with diabetes mellitus have the same meal plan? Because every person with diabetes mellitus is different, there is not one meal plan that works for everyone. It is very important that you meet with a dietitian who will help you create a meal plan that is just right for you. This information is not intended to replace advice given to you by your health care provider. Make sure you discuss any questions you have with your health care provider. Document Released: 08/25/2005 Document Revised: 05/05/2016 Document Reviewed: 10/25/2013 Elsevier Interactive Patient Education  2017 [employer].

## 2017-05-15 NOTE — Assessment & Plan Note (Signed)
Diagnosed in March 2018 with A1c is 8.2. Commended her on weight loss, will check A1c today. If A1c above goal, would recommend she start metformin as prescribed. Urine microalbumin pending. Initiated on lisinopril today for hypertension and kidney protection. Foot exam today unremarkable.

## 2017-05-15 NOTE — Assessment & Plan Note (Signed)
Check lipids today, also LFTs. Continue atorvastatin 40 mg for now.

## 2017-05-15 NOTE — Progress Notes (Signed)
Subjective:    Patient ID: Melissa Baldwin, female    DOB: 06/11/1956, 61 y.o.   MRN: [id number]  HPI  Melissa Baldwin is a 61 year old female who presents today for follow up.  1) Hyperlipidemia: Currently managed on atorvastatin 40 mg that was initiated in March 2018. Lipid panel in March 2018 with LDL at 210, TC of 322, Trigs at 250. She is compliant to her atorvastatin daily. She has recently lost 13 pounds through healthy changes in her diet.  2) Type 2 Diabetes: Currently managed on Metformin 500 mg BID. She's currently not taking Metformin, and never really started this regimen as she was nervous about the side effects.  A1C in March 2018 of 8.2.  Since her last visit she's worked on improvements in diet. She's cut down on pasta, bread, sweets. She's lost 13 pounds since her last visit. She denies numbness/tingling.   Wt Readings from Last 3 Encounters:  05/15/17 187 lb 1.9 oz (84.9 kg)  04/27/17 187 lb (84.8 kg)  01/25/17 200 lb (90.7 kg)     3) Essential Hypertension: Elevated during visit in February, above goal in the office today. She is currently not managed on treatment. She's checked her BP At various drugstores which was running 140's/80's. She denies chest pain, dizziness, shortness of breath.  Review of Systems  Constitutional: Negative for fatigue.  Eyes: Negative for visual disturbance.  Respiratory: Negative for shortness of breath.   Cardiovascular: Negative for chest pain.  Endocrine: Negative for polyuria.  Musculoskeletal: Negative for myalgias.  Neurological: Negative for dizziness and headaches.       Past Medical History:  Diagnosis Date  . Diabetes mellitus without complication ([place])   . Hyperlipidemia   . Seasonal allergies      Social History   Social History  . Marital status: Married    Spouse name: N/A  . Number of children: N/A  . Years of education: N/A   Occupational History  . Not on file.   Social History Main Topics  .  Smoking status: Never Smoker  . Smokeless tobacco: Never Used  . Alcohol use Yes  . Drug use: No  . Sexual activity: Not on file   Other Topics Concern  . Not on file   Social History Narrative   Married.   1 child.   Moved from [facility]   Works as a Art therapist.    Past Surgical History:  Procedure Laterality Date  . BREAST LUMPECTOMY Right    fibrocystic ? early 90's  . COLONOSCOPY WITH PROPOFOL N/A 04/27/2017   Procedure: COLONOSCOPY WITH PROPOFOL;  Surgeon: Jonathon Bellows, MD;  Location[facility] ENDOSCOPY;  Service: Endoscopy;  Laterality: N/A;  . TONSILLECTOMY    . UTERINE FIBROID SURGERY      Family History  Problem Relation Age of Onset  . Alzheimer's disease Mother   . Diabetes Paternal Grandfather     Allergies  Allergen Reactions  . Penicillins     Current Outpatient Prescriptions on File Prior to Visit  Medication Sig Dispense Refill  . atorvastatin (LIPITOR) 40 MG tablet Take 1 tablet (40 mg total) by mouth every evening. 90 tablet 1  . metFORMIN (GLUCOPHAGE) 500 MG tablet Take 1 tablet by mouth every morning with food for 2 weeks, then take 1 tablet by mouth twice daily with food thereafter. (Patient not taking: Reported on 05/15/2017) 180 tablet 0   No current facility-administered medications on file prior to visit.     BP  140/86   Pulse 79   Temp 97.5 F (36.4 C) (Oral)   Ht 5\' 3"  (1.6 m)   Wt 187 lb 1.9 oz (84.9 kg)   SpO2 99%   BMI 33.15 kg/m    Objective:   Physical Exam  Constitutional: She appears well-nourished.  Neck: Neck supple.  Cardiovascular: Normal rate and regular rhythm.   Pulmonary/Chest: Effort normal and breath sounds normal.  Skin: Skin is warm and dry.          Assessment & Plan:

## 2017-05-17 ENCOUNTER — Telehealth: Payer: Self-pay

## 2017-05-17 DIAGNOSIS — E119 Type 2 diabetes mellitus without complications: Secondary | ICD-10-CM

## 2017-05-17 NOTE — Telephone Encounter (Signed)
Pt left v/m; pt is just starting metformin; pt has been invited to a party and pt wants to know if she can drink 1 - 2 glasses of wine. Pt last seen 05/15/17. Pt request cb. Allie Bossier NP out of office.Please advise.

## 2017-05-17 NOTE — Telephone Encounter (Signed)
yes

## 2017-05-18 ENCOUNTER — Encounter: Payer: Self-pay | Admitting: Primary Care

## 2017-05-18 NOTE — Telephone Encounter (Signed)
Pt notified as instructed and voiced understanding. 

## 2017-05-22 MED ORDER — METFORMIN HCL 500 MG PO TABS: ORAL_TABLET | ORAL | 0 refills | Status: DC

## 2017-05-22 NOTE — Addendum Note (Signed)
Addended by: Jacqualin Combes on: 05/22/2017 04:48 PM   Modules accepted: Orders

## 2017-05-29 ENCOUNTER — Telehealth: Payer: Self-pay | Admitting: Primary Care

## 2017-05-29 ENCOUNTER — Other Ambulatory Visit (INDEPENDENT_AMBULATORY_CARE_PROVIDER_SITE_OTHER): Payer: 59

## 2017-05-29 DIAGNOSIS — I10 Essential (primary) hypertension: Secondary | ICD-10-CM

## 2017-05-29 LAB — BASIC METABOLIC PANEL
BUN: 16 mg/dL (ref 6–23)
CHLORIDE: 104 meq/L (ref 96–112)
CO2: 25 meq/L (ref 19–32)
CREATININE: 0.68 mg/dL (ref 0.40–1.20)
Calcium: 9.7 mg/dL (ref 8.4–10.5)
GFR: 93.49 mL/min (ref >60.00)
Glucose, Bld: 182 mg/dL — ABNORMAL HIGH (ref 70–99)
Potassium: 3.5 mEq/L (ref 3.5–5.1)
Sodium: 137 mEq/L (ref 135–145)

## 2017-05-29 NOTE — Telephone Encounter (Addendum)
-----   Message from Pleas Koch, NP sent at 05/15/2017  1:05 PM EDT ----- Regarding: BP Please check on BP since we initiated her on Lisinopril 10 mg, is she still taking this? I do recommend it. Will also need BMP to check kidneys and electrolytes. Please schedule, lab only appointment.Marland Kitchen

## 2017-05-29 NOTE — Telephone Encounter (Signed)
Message left for patient to return my call.  

## 2017-05-31 ENCOUNTER — Encounter: Payer: Self-pay | Admitting: *Deleted

## 2017-05-31 NOTE — Telephone Encounter (Signed)
Send patient a message through MyChart. 

## 2017-06-01 ENCOUNTER — Encounter: Payer: Self-pay | Admitting: Primary Care

## 2017-06-02 ENCOUNTER — Encounter: Payer: Self-pay | Admitting: Primary Care

## 2017-06-02 NOTE — Telephone Encounter (Signed)
Aware. 

## 2017-06-02 NOTE — Telephone Encounter (Signed)
per message on My Chart, Patient is taking the Lisinopril and lab was done 05/29/17.  Placed patient's reading BP.

## 2017-08-11 ENCOUNTER — Other Ambulatory Visit: Payer: Self-pay

## 2017-08-11 DIAGNOSIS — E119 Type 2 diabetes mellitus without complications: Secondary | ICD-10-CM

## 2017-08-11 DIAGNOSIS — E785 Hyperlipidemia, unspecified: Secondary | ICD-10-CM

## 2017-08-11 DIAGNOSIS — I10 Essential (primary) hypertension: Secondary | ICD-10-CM

## 2017-08-11 MED ORDER — ATORVASTATIN CALCIUM 40 MG PO TABS: 40.0000 mg | ORAL_TABLET | Freq: Every evening | ORAL | 1 refills | Status: DC

## 2017-08-11 MED ORDER — LISINOPRIL 10 MG PO TABS: 10.0000 mg | ORAL_TABLET | Freq: Every day | ORAL | 1 refills | Status: DC

## 2017-08-11 MED ORDER — METFORMIN HCL 500 MG PO TABS: ORAL_TABLET | ORAL | 1 refills | Status: DC

## 2017-08-11 NOTE — Telephone Encounter (Signed)
Pt request refill atorvastatin,lisinopril and metformin to [facility] (pt has set up acct with [facility]. Last annual 01/25/17. Pt will cb to schedule CPX. Refill done per protocol and pt voiced understanding.

## 2017-08-15 ENCOUNTER — Other Ambulatory Visit (INDEPENDENT_AMBULATORY_CARE_PROVIDER_SITE_OTHER): Payer: 59

## 2017-08-15 DIAGNOSIS — E119 Type 2 diabetes mellitus without complications: Secondary | ICD-10-CM | POA: Diagnosis not present

## 2017-08-15 LAB — HEMOGLOBIN A1C: Hgb A1c MFr Bld: 6.9 % — ABNORMAL HIGH (ref 4.6–6.5)

## 2017-08-17 ENCOUNTER — Other Ambulatory Visit: Payer: Self-pay | Admitting: Primary Care

## 2017-08-17 DIAGNOSIS — E785 Hyperlipidemia, unspecified: Secondary | ICD-10-CM

## 2017-08-19 ENCOUNTER — Other Ambulatory Visit: Payer: Self-pay | Admitting: Primary Care

## 2017-08-19 DIAGNOSIS — E119 Type 2 diabetes mellitus without complications: Secondary | ICD-10-CM

## 2017-08-25 ENCOUNTER — Other Ambulatory Visit: Payer: Self-pay | Admitting: Primary Care

## 2017-08-25 DIAGNOSIS — I10 Essential (primary) hypertension: Secondary | ICD-10-CM

## 2017-09-02 ENCOUNTER — Other Ambulatory Visit: Payer: Self-pay | Admitting: Primary Care

## 2017-09-02 DIAGNOSIS — I10 Essential (primary) hypertension: Secondary | ICD-10-CM

## 2017-09-06 ENCOUNTER — Other Ambulatory Visit: Payer: Self-pay | Admitting: Primary Care

## 2017-09-06 DIAGNOSIS — I10 Essential (primary) hypertension: Secondary | ICD-10-CM

## 2017-09-06 MED ORDER — LISINOPRIL 10 MG PO TABS: 10.0000 mg | ORAL_TABLET | Freq: Every day | ORAL | 1 refills | Status: DC

## 2017-12-15 ENCOUNTER — Ambulatory Visit: Payer: 59 | Admitting: Primary Care

## 2018-01-19 ENCOUNTER — Other Ambulatory Visit: Payer: Self-pay | Admitting: Primary Care

## 2018-01-19 DIAGNOSIS — E785 Hyperlipidemia, unspecified: Secondary | ICD-10-CM

## 2018-01-19 DIAGNOSIS — I10 Essential (primary) hypertension: Secondary | ICD-10-CM

## 2018-01-19 DIAGNOSIS — E119 Type 2 diabetes mellitus without complications: Secondary | ICD-10-CM

## 2018-01-25 ENCOUNTER — Other Ambulatory Visit: Payer: 59

## 2018-01-31 ENCOUNTER — Encounter: Payer: 59 | Admitting: Primary Care

## 2018-02-08 ENCOUNTER — Other Ambulatory Visit (INDEPENDENT_AMBULATORY_CARE_PROVIDER_SITE_OTHER): Payer: 59

## 2018-02-08 DIAGNOSIS — E785 Hyperlipidemia, unspecified: Secondary | ICD-10-CM

## 2018-02-08 DIAGNOSIS — E119 Type 2 diabetes mellitus without complications: Secondary | ICD-10-CM

## 2018-02-08 DIAGNOSIS — I10 Essential (primary) hypertension: Secondary | ICD-10-CM

## 2018-02-08 LAB — COMPREHENSIVE METABOLIC PANEL
ALBUMIN: 4.2 g/dL (ref 3.5–5.2)
ALT: 26 U/L (ref 0–35)
AST: 19 U/L (ref 0–37)
Alkaline Phosphatase: 91 U/L (ref 39–117)
BUN: 19 mg/dL (ref 6–23)
CHLORIDE: 99 meq/L (ref 96–112)
CO2: 29 mEq/L (ref 19–32)
Calcium: 10.3 mg/dL (ref 8.4–10.5)
Creatinine, Ser: 0.66 mg/dL (ref 0.40–1.20)
GFR: 96.54 mL/min (ref >60.00)
Glucose, Bld: 152 mg/dL — ABNORMAL HIGH (ref 70–99)
POTASSIUM: 4 meq/L (ref 3.5–5.1)
Sodium: 136 mEq/L (ref 135–145)
Total Bilirubin: 0.8 mg/dL (ref 0.2–1.2)
Total Protein: 8.1 g/dL (ref 6.0–8.3)

## 2018-02-08 LAB — LIPID PANEL
CHOLESTEROL: 146 mg/dL (ref 0–200)
HDL: 46.9 mg/dL (ref >39.00)
LDL CALC: 81 mg/dL (ref 0–99)
NonHDL: 99.28
Total CHOL/HDL Ratio: 3
Triglycerides: 91 mg/dL (ref 0.0–149.0)
VLDL: 18.2 mg/dL (ref 0.0–40.0)

## 2018-02-08 LAB — HEMOGLOBIN A1C: Hgb A1c MFr Bld: 7 % — ABNORMAL HIGH (ref 4.6–6.5)

## 2018-02-15 ENCOUNTER — Telehealth: Payer: Self-pay | Admitting: Primary Care

## 2018-02-15 ENCOUNTER — Ambulatory Visit (INDEPENDENT_AMBULATORY_CARE_PROVIDER_SITE_OTHER): Payer: 59 | Admitting: Primary Care

## 2018-02-15 ENCOUNTER — Encounter: Payer: Self-pay | Admitting: Primary Care

## 2018-02-15 VITALS — BP 130/82 | HR 84 | Temp 98.1°F | Ht 63.0 in | Wt 173.2 lb

## 2018-02-15 DIAGNOSIS — E119 Type 2 diabetes mellitus without complications: Secondary | ICD-10-CM | POA: Diagnosis not present

## 2018-02-15 DIAGNOSIS — Z23 Encounter for immunization: Secondary | ICD-10-CM | POA: Diagnosis not present

## 2018-02-15 DIAGNOSIS — E785 Hyperlipidemia, unspecified: Secondary | ICD-10-CM

## 2018-02-15 DIAGNOSIS — Q828 Other specified congenital malformations of skin: Secondary | ICD-10-CM

## 2018-02-15 DIAGNOSIS — Z1231 Encounter for screening mammogram for malignant neoplasm of breast: Secondary | ICD-10-CM | POA: Diagnosis not present

## 2018-02-15 DIAGNOSIS — Z0000 Encounter for general adult medical examination without abnormal findings: Secondary | ICD-10-CM | POA: Diagnosis not present

## 2018-02-15 DIAGNOSIS — I10 Essential (primary) hypertension: Secondary | ICD-10-CM | POA: Diagnosis not present

## 2018-02-15 DIAGNOSIS — Z1239 Encounter for other screening for malignant neoplasm of breast: Secondary | ICD-10-CM

## 2018-02-15 NOTE — Assessment & Plan Note (Signed)
Td UTD, pneumovax provided today. Declines Shingles and influenza vaccinations.  Pap smear UTD. Mammogram due this May, orders placed. Colonoscopy UTD. Discussed the importance of a healthy diet and regular exercise in order for weight loss, and to reduce the risk of any potential medical problems. Exam unremarkable. Labs stable. Referral placed to Dermatology for skin tag evaluation.

## 2018-02-15 NOTE — Progress Notes (Signed)
Subjective:    Patient ID: Melissa Baldwin, female    DOB: [birth year], 62 y.o.   MRN: [id number]  HPI  Melissa Baldwin is a 62 year old female who presents today for complete physical. She is also requesting evaluation for skin tag removal.   Immunizations: -Tetanus: Completed in 2013 -Influenza: Declines  -Pneumonia: Due -Shingles: Declines   Diet: She endorses a healthy diet Breakfast: Egg, english muffin, fruit, cereal, occasionally goes out Lunch: Fruit Dinner: Chicken, vegetables, quinoa, soup Snacks: None Desserts: Occasional  Beverages: Water, un-sweet tea, 2 glasses of wine weekly  Exercise: She walks her dogs 2 miles daily Eye exam: Due in April 2019 Dental exam: Completes semi-annually  Colonoscopy: Completed in 2018 Pap Smear: Completed in 2018 Mammogram: Completed in May 2018   Review of Systems  Constitutional: Negative for unexpected weight change.  HENT: Negative for rhinorrhea.   Respiratory: Negative for cough and shortness of breath.   Cardiovascular: Negative for chest pain.  Gastrointestinal: Negative for constipation and diarrhea.  Genitourinary: Negative for difficulty urinating and menstrual problem.  Musculoskeletal: Negative for arthralgias and myalgias.  Skin: Negative for rash.  Allergic/Immunologic: Negative for environmental allergies.  Neurological: Negative for dizziness, numbness and headaches.  Psychiatric/Behavioral: The patient is not nervous/anxious.        Past Medical History:  Diagnosis Date  . Diabetes mellitus without complication ([place])   . Hyperlipidemia   . Seasonal allergies      Social History   Socioeconomic History  . Marital status: Married    Spouse name: Not on file  . Number of children: Not on file  . Years of education: Not on file  . Highest education level: Not on file  Social Needs  . Financial resource strain: Not on file  . Food insecurity - worry: Not on file  . Food insecurity - inability: Not  on file  . Transportation needs - medical: Not on file  . Transportation needs - non-medical: Not on file  Occupational History  . Not on file  Tobacco Use  . Smoking status: Never Smoker  . Smokeless tobacco: Never Used  Substance and Sexual Activity  . Alcohol use: Yes  . Drug use: No  . Sexual activity: Not on file  Other Topics Concern  . Not on file  Social History Narrative   Married.   1 child.   Moved from [facility]   Works as a Art therapist.    Past Surgical History:  Procedure Laterality Date  . BREAST LUMPECTOMY Right    fibrocystic ? early 90's  . COLONOSCOPY WITH PROPOFOL N/A 04/27/2017   Procedure: COLONOSCOPY WITH PROPOFOL;  Surgeon: Jonathon Bellows, MD;  Location: [facility] ENDOSCOPY;  Service: Endoscopy;  Laterality: N/A;  . TONSILLECTOMY    . UTERINE FIBROID SURGERY      Family History  Problem Relation Age of Onset  . Alzheimer's disease Mother   . Diabetes Paternal Grandfather     Allergies  Allergen Reactions  . Penicillins     Current Outpatient Medications on File Prior to Visit  Medication Sig Dispense Refill  . atorvastatin (LIPITOR) 40 MG tablet TAKE 1 TABLET (40 MG TOTAL) BY MOUTH EVERY EVENING. 90 tablet 1  . lisinopril (PRINIVIL,ZESTRIL) 10 MG tablet Take 1 tablet (10 mg total) by mouth daily. 90 tablet 1  . metFORMIN (GLUCOPHAGE) 500 MG tablet Take 1 tablet by mouth every morning with food for 2 weeks, then take 1 tablet by mouth twice daily with food thereafter. [employer]  tablet 1   No current facility-administered medications on file prior to visit.     BP 130/82   Pulse 84   Temp 98.1 F (36.7 C) (Oral)   Ht 5\' 3"  (1.6 m)   Wt 173 lb 4 oz (78.6 kg)   SpO2 98%   BMI 30.69 kg/m    Objective:   Physical Exam  Constitutional: She is oriented to person, place, and time. She appears well-nourished.  HENT:  Right Ear: Tympanic membrane and ear canal normal.  Left Ear: Tympanic membrane and ear canal normal.  Nose: Nose normal.    Mouth/Throat: Oropharynx is clear and moist.  Eyes: Conjunctivae and EOM are normal. Pupils are equal, round, and reactive to light.  Neck: Neck supple. No thyromegaly present.  Cardiovascular: Normal rate and regular rhythm.  No murmur heard. Pulmonary/Chest: Effort normal and breath sounds normal. She has no rales.  Abdominal: Soft. Bowel sounds are normal. There is no tenderness.  Musculoskeletal: Normal range of motion.  Lymphadenopathy:    She has no cervical adenopathy.  Neurological: She is alert and oriented to person, place, and time. She has normal reflexes. No cranial nerve deficit.  Skin: Skin is warm and dry. No rash noted.  Psychiatric: She has a normal mood and affect.          Assessment & Plan:

## 2018-02-15 NOTE — Assessment & Plan Note (Signed)
Recent lipid panel stable, continue atorvastatin. 

## 2018-02-15 NOTE — Telephone Encounter (Signed)
Scheduled patient for her 6 mo follow up. Does she needs labs prior?

## 2018-02-15 NOTE — Patient Instructions (Signed)
Call the [facility] to schedule your mammogram.  You will be contacted regarding your referral to Dermatology.  Please let us know if you have not been contacted within one week.   Start exercising. You should be getting 150 minutes of moderate intensity exercise weekly.  Continue to work on a healthy diet.  Ensure you are consuming 64 ounces of water daily.  Complete your eye exam as scheduled.  Please schedule a follow up appointment in 6 months.  It was a pleasure to see you today!   Preventive Care 40-64 Years, Female Preventive care refers to lifestyle choices and visits with your health care provider that can promote health and wellness. What does preventive care include?  A yearly physical exam. This is also called an annual well check.  Dental exams once or twice a year.  Routine eye exams. Ask your health care provider how often you should have your eyes checked.  Personal lifestyle choices, including: ? Daily care of your teeth and gums. ? Regular physical activity. ? Eating a healthy diet. ? Avoiding tobacco and drug use. ? Limiting alcohol use. ? Practicing safe sex. ? Taking low-dose aspirin daily starting at age 1. ? Taking vitamin and mineral supplements as recommended by your health care provider. What happens during an annual well check? The services and screenings done by your health care provider during your annual well check will depend on your age, overall health, lifestyle risk factors, and family history of disease. Counseling Your health care provider may ask you questions about your:  Alcohol use.  Tobacco use.  Drug use.  Emotional well-being.  Home and relationship well-being.  Sexual activity.  Eating habits.  Work and work Statistician.  Method of birth control.  Menstrual cycle.  Pregnancy history.  Screening You may have the following tests or measurements:  Height, weight, and BMI.  Blood pressure.  Lipid  and cholesterol levels. These may be checked every 5 years, or more frequently if you are over 67 years old.  Skin check.  Lung cancer screening. You may have this screening every year starting at age 55 if you have a 30-pack-year history of smoking and currently smoke or have quit within the past 15 years.  Fecal occult blood test (FOBT) of the stool. You may have this test every year starting at age 55.  Flexible sigmoidoscopy or colonoscopy. You may have a sigmoidoscopy every 5 years or a colonoscopy every 10 years starting at age 65.  Hepatitis C blood test.  Hepatitis B blood test.  Sexually transmitted disease (STD) testing.  Diabetes screening. This is done by checking your blood sugar (glucose) after you have not eaten for a while (fasting). You may have this done every 1-3 years.  Mammogram. This may be done every 1-2 years. Talk to your health care provider about when you should start having regular mammograms. This may depend on whether you have a family history of breast cancer.  BRCA-related cancer screening. This may be done if you have a family history of breast, ovarian, tubal, or peritoneal cancers.  Pelvic exam and Pap test. This may be done every 3 years starting at age 33. Starting at age 54, this may be done every 5 years if you have a Pap test in combination with an HPV test.  Bone density scan. This is done to screen for osteoporosis. You may have this scan if you are at high risk for osteoporosis.  Discuss your test results, treatment options,  and if necessary, the need for more tests with your health care provider. Vaccines Your health care provider may recommend certain vaccines, such as:  Influenza vaccine. This is recommended every year.  Tetanus, diphtheria, and acellular pertussis (Tdap, Td) vaccine. You may need a Td booster every 10 years.  Varicella vaccine. You may need this if you have not been vaccinated.  Zoster vaccine. You may need this after  age 95.  Measles, mumps, and rubella (MMR) vaccine. You may need at least one dose of MMR if you were born in 1957 or later. You may also need a second dose.  Pneumococcal 13-valent conjugate (PCV13) vaccine. You may need this if you have certain conditions and were not previously vaccinated.  Pneumococcal polysaccharide (PPSV23) vaccine. You may need one or two doses if you smoke cigarettes or if you have certain conditions.  Meningococcal vaccine. You may need this if you have certain conditions.  Hepatitis A vaccine. You may need this if you have certain conditions or if you travel or work in places where you may be exposed to hepatitis A.  Hepatitis B vaccine. You may need this if you have certain conditions or if you travel or work in places where you may be exposed to hepatitis B.  Haemophilus influenzae type b (Hib) vaccine. You may need this if you have certain conditions.  Talk to your health care provider about which screenings and vaccines you need and how often you need them. This information is not intended to replace advice given to you by your health care provider. Make sure you discuss any questions you have with your health care provider. Document Released: 12/25/2015 Document Revised: 08/17/2016 Document Reviewed: 09/29/2015 Elsevier Interactive Patient Education  [employer].

## 2018-02-15 NOTE — Assessment & Plan Note (Signed)
Recent A1C of 7.0 which is at goal. Continue Metformin 500 mg BID. Foot exam due next visit. Pneumovax provided today. Managed on statin and ACE.   Discussed the importance of a healthy diet and regular exercise in order for weight loss, and to reduce the risk of any potential medical problems.

## 2018-02-15 NOTE — Telephone Encounter (Signed)
If patient can due to her schedule then please schedule lab appt prior.

## 2018-02-15 NOTE — Addendum Note (Signed)
Addended by: Jacqualin Combes on: 02/15/2018 12:30 PM   Modules accepted: Orders

## 2018-02-15 NOTE — Assessment & Plan Note (Signed)
Stable in the office today, continue lisinopril 10 mg. BMP unremarkable.  

## 2018-02-16 ENCOUNTER — Encounter: Payer: Self-pay | Admitting: Primary Care

## 2018-03-26 DIAGNOSIS — L820 Inflamed seborrheic keratosis: Secondary | ICD-10-CM | POA: Diagnosis not present

## 2018-03-26 DIAGNOSIS — L578 Other skin changes due to chronic exposure to nonionizing radiation: Secondary | ICD-10-CM | POA: Diagnosis not present

## 2018-03-26 DIAGNOSIS — L821 Other seborrheic keratosis: Secondary | ICD-10-CM | POA: Diagnosis not present

## 2018-04-09 ENCOUNTER — Telehealth: Payer: Self-pay | Admitting: Primary Care

## 2018-04-09 DIAGNOSIS — E785 Hyperlipidemia, unspecified: Secondary | ICD-10-CM

## 2018-04-09 DIAGNOSIS — I10 Essential (primary) hypertension: Secondary | ICD-10-CM

## 2018-04-09 MED ORDER — ATORVASTATIN CALCIUM 40 MG PO TABS: 40.0000 mg | ORAL_TABLET | Freq: Every evening | ORAL | 1 refills | Status: DC

## 2018-04-09 MED ORDER — LISINOPRIL 10 MG PO TABS: 10.0000 mg | ORAL_TABLET | Freq: Every day | ORAL | 1 refills | Status: DC

## 2018-04-09 NOTE — Telephone Encounter (Signed)
Copied from [place] [id number]. Topic: Quick Communication - Rx Refill/Question >> Apr 09, 2018  8:15 AM Scherrie Gerlach wrote: Medication: lisinopril (PRINIVIL,ZESTRIL) 10 MG tablet                     atorvastatin (LIPITOR) 40 MG tablet Has the patient contacted their pharmacy? yes 90 day refill  [place], [place] [employer] [phone] (Phone) [phone] (Fax)  Pt states Tuesday 4/30 is the last day she can use this insurance and requesting refill by tomorrow

## 2018-05-09 ENCOUNTER — Ambulatory Visit
Admission: RE | Admit: 2018-05-09 | Discharge: 2018-05-09 | Disposition: A | Discharge status: Home / Self Care | Payer: 59 | Source: Ambulatory Visit | Attending: Primary Care | Admitting: Primary Care

## 2018-05-09 DIAGNOSIS — Z1239 Encounter for other screening for malignant neoplasm of breast: Secondary | ICD-10-CM

## 2018-05-09 DIAGNOSIS — Z1231 Encounter for screening mammogram for malignant neoplasm of breast: Secondary | ICD-10-CM | POA: Insufficient documentation

## 2018-08-16 ENCOUNTER — Other Ambulatory Visit: Payer: Self-pay | Admitting: Primary Care

## 2018-08-16 DIAGNOSIS — E119 Type 2 diabetes mellitus without complications: Secondary | ICD-10-CM

## 2018-08-20 ENCOUNTER — Ambulatory Visit: Payer: 59 | Admitting: Primary Care

## 2018-08-20 ENCOUNTER — Other Ambulatory Visit (INDEPENDENT_AMBULATORY_CARE_PROVIDER_SITE_OTHER): Payer: 59

## 2018-08-20 DIAGNOSIS — E119 Type 2 diabetes mellitus without complications: Secondary | ICD-10-CM | POA: Diagnosis not present

## 2018-08-20 LAB — POCT GLYCOSYLATED HEMOGLOBIN (HGB A1C): Hemoglobin A1C: 6.4 % — AB (ref 4.0–5.6)

## 2018-08-23 ENCOUNTER — Encounter: Payer: Self-pay | Admitting: Primary Care

## 2018-08-23 ENCOUNTER — Ambulatory Visit (INDEPENDENT_AMBULATORY_CARE_PROVIDER_SITE_OTHER): Payer: 59 | Admitting: Primary Care

## 2018-08-23 VITALS — BP 126/78 | HR 85 | Temp 98.1°F | Ht 63.0 in | Wt 177.2 lb

## 2018-08-23 DIAGNOSIS — Z23 Encounter for immunization: Secondary | ICD-10-CM | POA: Diagnosis not present

## 2018-08-23 DIAGNOSIS — I10 Essential (primary) hypertension: Secondary | ICD-10-CM

## 2018-08-23 DIAGNOSIS — E119 Type 2 diabetes mellitus without complications: Secondary | ICD-10-CM

## 2018-08-23 NOTE — Progress Notes (Signed)
Subjective:    Patient ID: Melissa Baldwin, female    DOB: 22-Apr-1956, 62 y.o.   MRN: [id number]  HPI  Ms. Melissa Baldwin is a 62 year old female who presents today for follow up of diabetes.  Current medications include: Metformin 500 mg BID when she remembers. She does forget sometimes.   Last A1C: 6.4 recently Last Eye Exam: Completed in May 2019 Last Foot Exam: Due Pneumonia Vaccination: ACE/ARB: Lisinopril  Statin: Lipitor  Diet currently consists of:  Breakfast: Cereal, fruit Lunch: Salad with protein, sandwich Dinner: Some pasta, cheese, meat, vegetables Snacks: None Desserts: Once weekly  Beverages: Wine, unsweet tea, water, tomato juice, occasional soda  Exercise: She is walking her dogs three miles daily   BP Readings from Last 3 Encounters:  08/23/18 126/78  02/15/18 130/82  05/15/17 140/86       Review of Systems  Eyes: Negative for visual disturbance.  Respiratory: Negative for shortness of breath.   Cardiovascular: Negative for chest pain.  Neurological: Negative for dizziness and numbness.       Past Medical History:  Diagnosis Date  . Diabetes mellitus without complication ([place])   . Hyperlipidemia   . Seasonal allergies      Social History   Socioeconomic History  . Marital status: Married    Spouse name: Not on file  . Number of children: Not on file  . Years of education: Not on file  . Highest education level: Not on file  Occupational History  . Not on file  Social Needs  . Financial resource strain: Not on file  . Food insecurity:    Worry: Not on file    Inability: Not on file  . Transportation needs:    Medical: Not on file    Non-medical: Not on file  Tobacco Use  . Smoking status: Never Smoker  . Smokeless tobacco: Never Used  Substance and Sexual Activity  . Alcohol use: Yes  . Drug use: No  . Sexual activity: Not on file  Lifestyle  . Physical activity:    Days per week: Not on file    Minutes per session: Not  on file  . Stress: Not on file  Relationships  . Social connections:    Talks on phone: Not on file    Gets together: Not on file    Attends religious service: Not on file    Active member of club or organization: Not on file    Attends meetings of clubs or organizations: Not on file    Relationship status: Not on file  . Intimate partner violence:    Fear of current or ex partner: Not on file    Emotionally abused: Not on file    Physically abused: Not on file    Forced sexual activity: Not on file  Other Topics Concern  . Not on file  Social History Narrative   Married.   1 child.   Moved [place] [facility]   Works as a Art therapist.    Past Surgical History:  Procedure Laterality Date  . BREAST BIOPSY Right    neg  . BREAST CYST ASPIRATION Left   . BREAST LUMPECTOMY Right    fibrocystic ? early 90's  . COLONOSCOPY WITH PROPOFOL N/A 04/27/2017   Procedure: COLONOSCOPY WITH PROPOFOL;  Surgeon: Jonathon Bellows, MD;  Location: [facility] ENDOSCOPY;  Service: Endoscopy;  Laterality: N/A;  . TONSILLECTOMY    . UTERINE FIBROID SURGERY      Family History  Problem Relation  Age of Onset  . Alzheimer's disease Mother   . Diabetes Paternal Grandfather   . Breast cancer Neg Hx     Allergies  Allergen Reactions  . Penicillins     Current Outpatient Medications on File Prior to Visit  Medication Sig Dispense Refill  . atorvastatin (LIPITOR) 40 MG tablet Take 1 tablet (40 mg total) by mouth every evening. 90 tablet 1  . lisinopril (PRINIVIL,ZESTRIL) 10 MG tablet Take 1 tablet (10 mg total) by mouth daily. 90 tablet 1  . metFORMIN (GLUCOPHAGE) 500 MG tablet Take 1 tablet by mouth every morning with food for 2 weeks, then take 1 tablet by mouth twice daily with food thereafter. 180 tablet 1   No current facility-administered medications on file prior to visit.     BP 126/78   Pulse 85   Temp 98.1 F (36.7 C) (Oral)   Ht 5\' 3"  (1.6 m)   Wt 177 lb 4 oz (80.4 kg)   SpO2 98%   BMI  31.40 kg/m    Objective:   Physical Exam  Constitutional: She appears well-nourished.  Neck: Neck supple.  Cardiovascular: Normal rate and regular rhythm.  Respiratory: Effort normal and breath sounds normal.  Skin: Skin is warm and dry.           Assessment & Plan:

## 2018-08-23 NOTE — Assessment & Plan Note (Signed)
Recent A1C of 6.4 which is well controlled. Continue Metformin BID. Continue atorvastatin.  Foot and eye exam UTD. Flu shot provided today.  Pneumonia vaccination UTD. Managed on ACE for renal protection.  Follow up in 6 months.

## 2018-08-23 NOTE — Assessment & Plan Note (Signed)
Stable in the office today. Continue lisinopril 10 mg.

## 2018-08-23 NOTE — Addendum Note (Signed)
Addended by: Jacqualin Combes on: 08/23/2018 10:33 AM   Modules accepted: Orders

## 2018-08-23 NOTE — Patient Instructions (Addendum)
Continue Metformin 500 mg twice daily.  Continue exercising. You should be getting 150 minutes of moderate intensity exercise weekly.  Continue to work on [employer] as discussed.  We will see you next March for your physical. please schedule this at your convenience.   It was a pleasure to see you today!

## 2018-08-27 ENCOUNTER — Other Ambulatory Visit: Payer: Self-pay | Admitting: Primary Care

## 2018-08-27 DIAGNOSIS — I10 Essential (primary) hypertension: Secondary | ICD-10-CM

## 2018-08-27 DIAGNOSIS — E785 Hyperlipidemia, unspecified: Secondary | ICD-10-CM

## 2018-12-19 DIAGNOSIS — E119 Type 2 diabetes mellitus without complications: Secondary | ICD-10-CM

## 2018-12-20 MED ORDER — METFORMIN HCL 500 MG PO TABS: ORAL_TABLET | ORAL | 1 refills | Status: DC

## 2018-12-24 MED ORDER — METFORMIN HCL 500 MG PO TABS: ORAL_TABLET | ORAL | 1 refills | Status: DC

## 2018-12-24 NOTE — Addendum Note (Signed)
Addended by: Pilar Grammes on: 12/24/2018 04:40 PM   Modules accepted: Orders

## 2018-12-30 IMAGING — MG MM DIGITAL SCREENING BILAT W/ TOMO W/ CAD
8 of 12 series · 8 of 28 positions shown · non-contrast
Comparison: Previous exam(s).

CLINICAL DATA: Screening.

EXAM:
2D DIGITAL SCREENING BILATERAL MAMMOGRAM WITH CAD AND ADJUNCT TOMO

[R CC synth-2D]
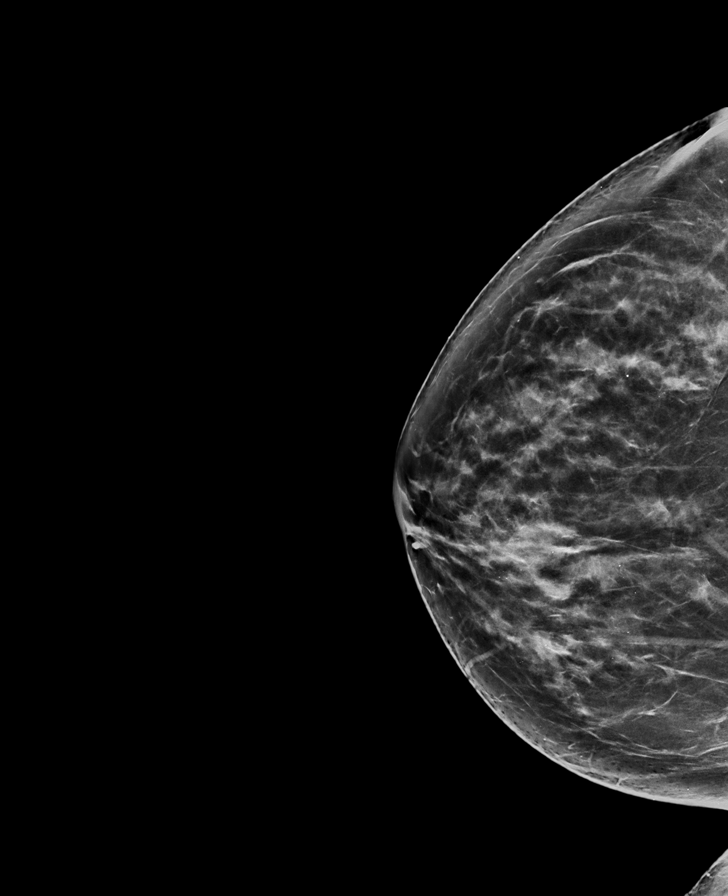

[R CC]
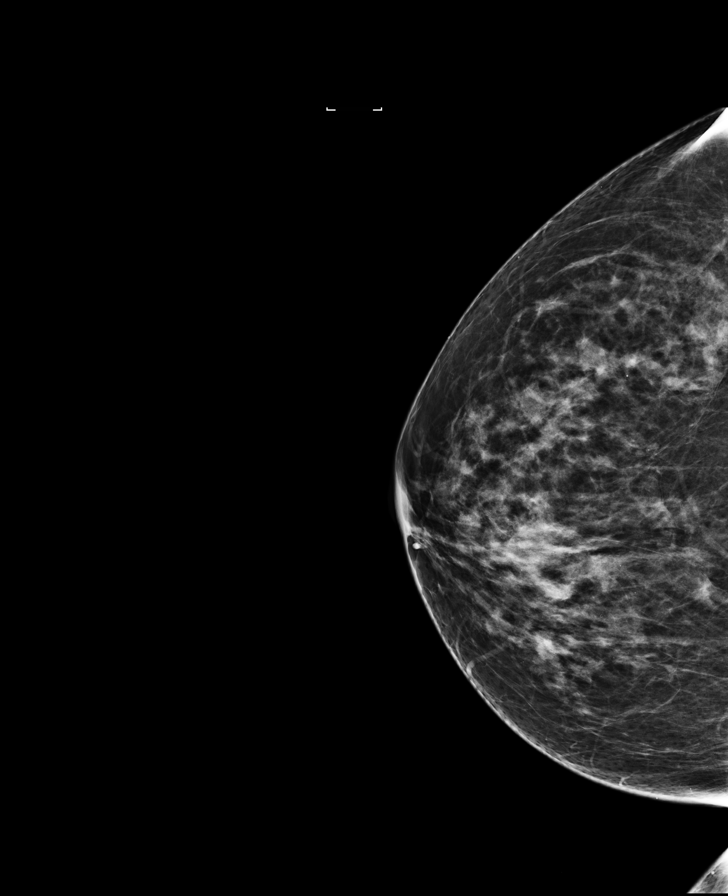

[R MLO synth-2D]
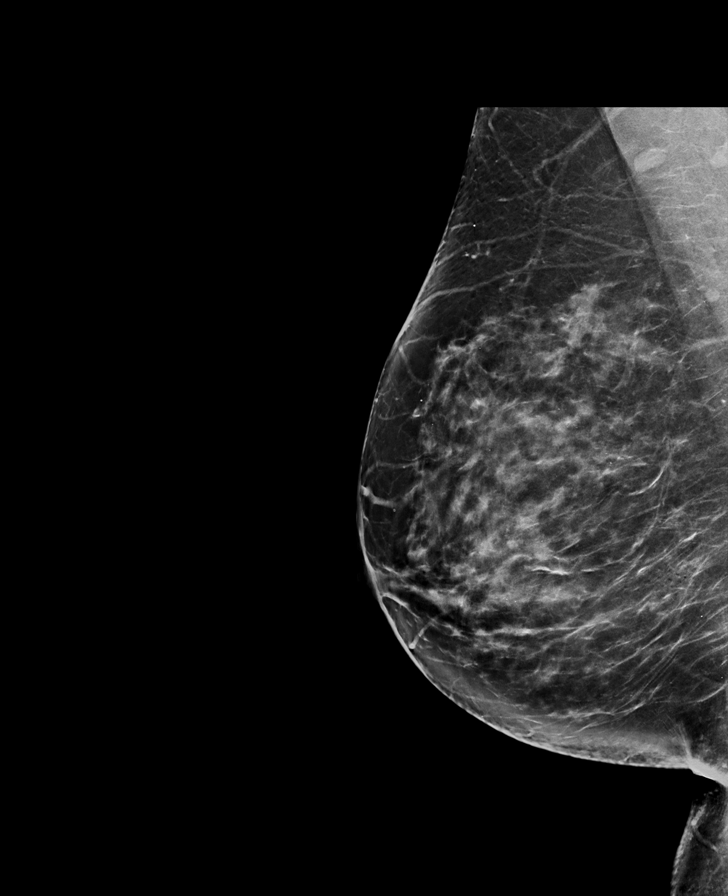

[L CC]
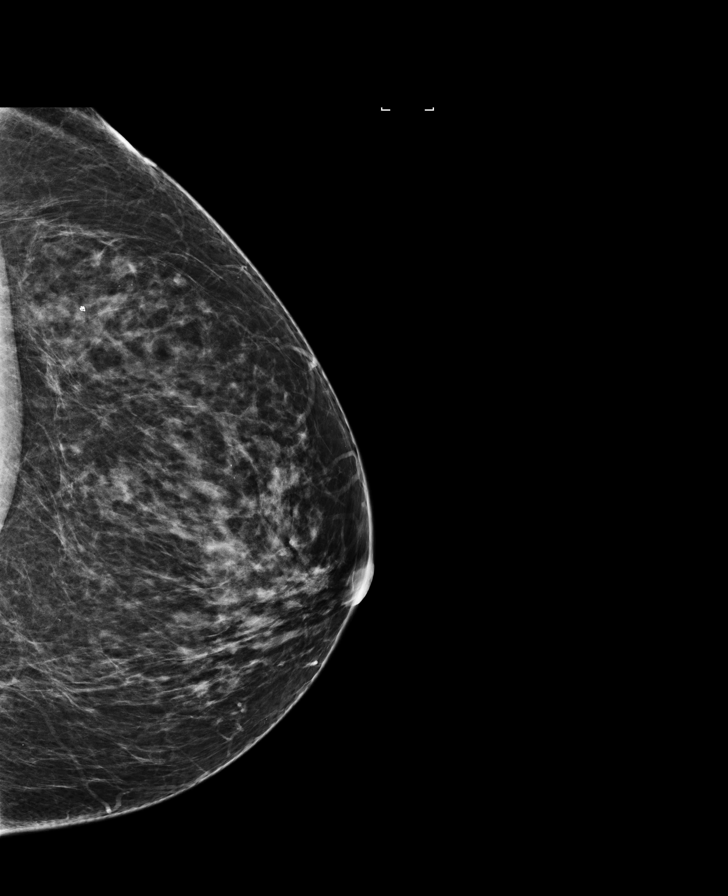

[L MLO synth-2D]
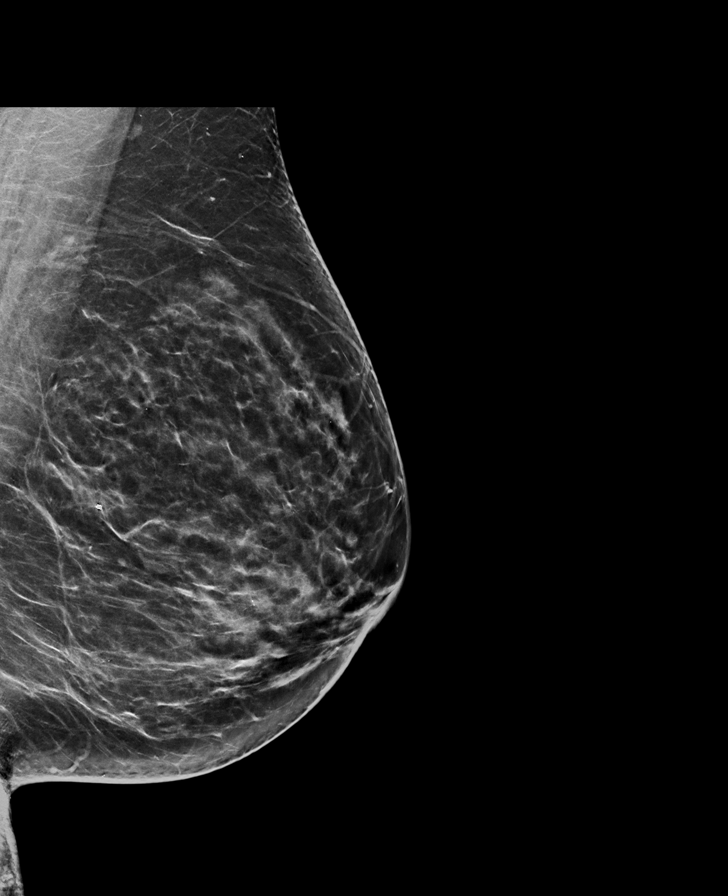

[R MLO]
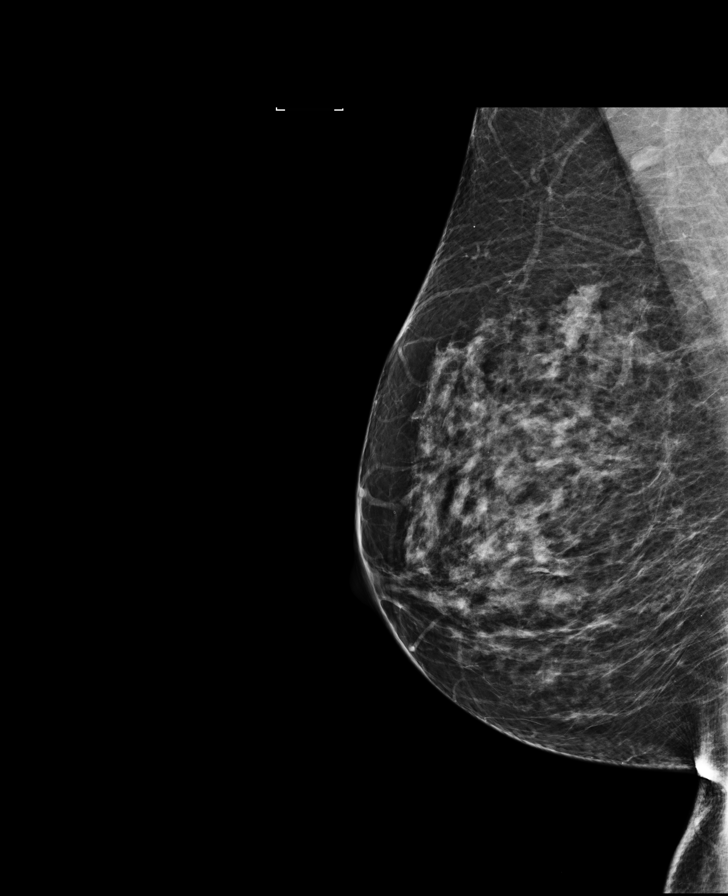

[L CC synth-2D]
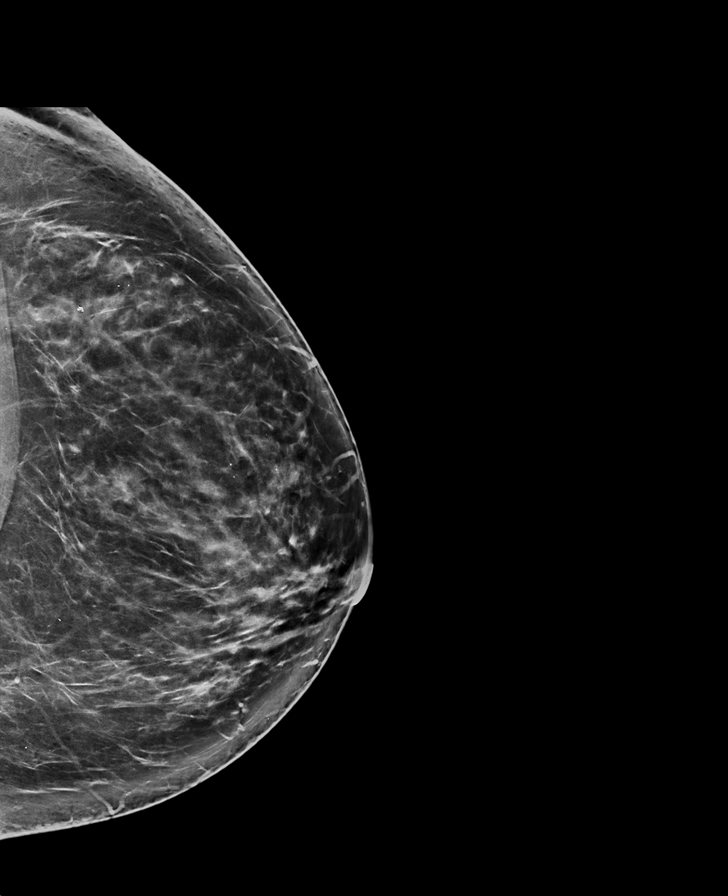

[L MLO]
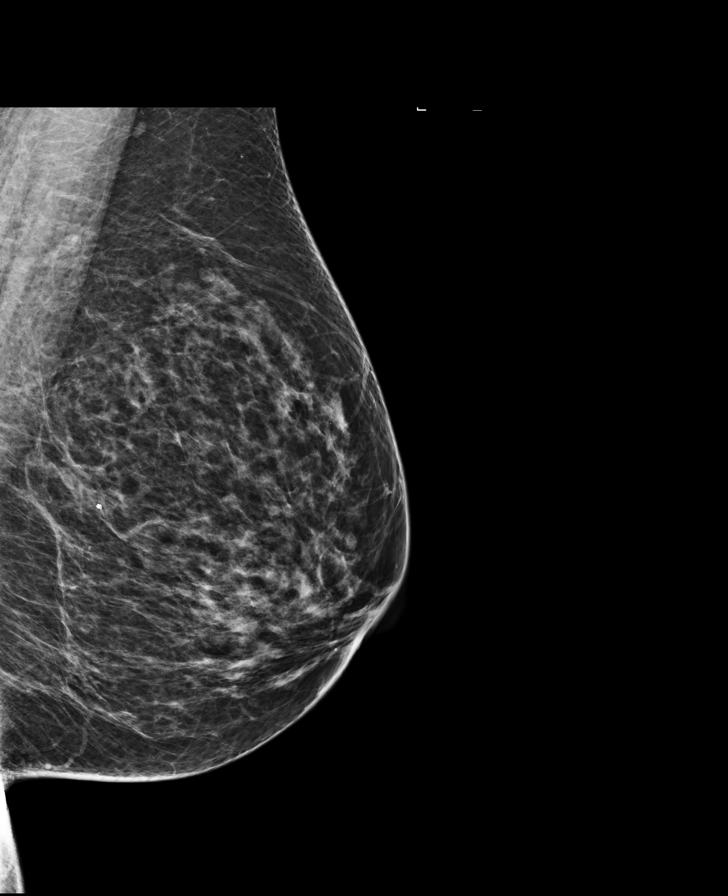

[8 of 28 positions shown; findings below may reference images not displayed]

ACR Breast Density Category c: The breast tissue is heterogeneously
dense, which may obscure small masses.
FINDINGS: There are no findings suspicious for malignancy. Images were
processed with CAD.
IMPRESSION: No mammographic evidence of malignancy. A result letter of this
screening mammogram will be mailed directly to the patient.

RECOMMENDATION:
Screening mammogram in one year. (Code:[id number])

BI-RADS CATEGORY  1: Negative.

## 2019-02-04 ENCOUNTER — Other Ambulatory Visit: Payer: Self-pay | Admitting: Primary Care

## 2019-02-04 DIAGNOSIS — I10 Essential (primary) hypertension: Secondary | ICD-10-CM

## 2019-02-04 DIAGNOSIS — Z1159 Encounter for screening for other viral diseases: Secondary | ICD-10-CM

## 2019-02-04 DIAGNOSIS — E119 Type 2 diabetes mellitus without complications: Secondary | ICD-10-CM

## 2019-02-04 DIAGNOSIS — E785 Hyperlipidemia, unspecified: Secondary | ICD-10-CM

## 2019-02-13 ENCOUNTER — Other Ambulatory Visit: Payer: 59

## 2019-02-18 ENCOUNTER — Encounter: Payer: 59 | Admitting: Primary Care

## 2019-03-20 ENCOUNTER — Other Ambulatory Visit: Payer: 59

## 2019-03-25 ENCOUNTER — Encounter: Payer: 59 | Admitting: Primary Care

## 2019-03-27 DIAGNOSIS — Z1239 Encounter for other screening for malignant neoplasm of breast: Secondary | ICD-10-CM

## 2019-06-07 ENCOUNTER — Other Ambulatory Visit: Payer: Self-pay

## 2019-06-07 ENCOUNTER — Other Ambulatory Visit (INDEPENDENT_AMBULATORY_CARE_PROVIDER_SITE_OTHER): Payer: BLUE CROSS/BLUE SHIELD

## 2019-06-07 DIAGNOSIS — E119 Type 2 diabetes mellitus without complications: Secondary | ICD-10-CM

## 2019-06-07 DIAGNOSIS — I10 Essential (primary) hypertension: Secondary | ICD-10-CM | POA: Diagnosis not present

## 2019-06-07 DIAGNOSIS — E785 Hyperlipidemia, unspecified: Secondary | ICD-10-CM

## 2019-06-07 DIAGNOSIS — Z1159 Encounter for screening for other viral diseases: Secondary | ICD-10-CM

## 2019-06-07 LAB — LIPID PANEL
Cholesterol: 179 mg/dL (ref 0–200)
HDL: 53 mg/dL (ref >39.00)
LDL Cholesterol: 103 mg/dL — ABNORMAL HIGH (ref 0–99)
NonHDL: 125.95
Total CHOL/HDL Ratio: 3
Triglycerides: 114 mg/dL (ref 0.0–149.0)
VLDL: 22.8 mg/dL (ref 0.0–40.0)

## 2019-06-07 LAB — COMPREHENSIVE METABOLIC PANEL
ALT: 17 U/L (ref 0–35)
AST: 13 U/L (ref 0–37)
Albumin: 4.4 g/dL (ref 3.5–5.2)
Alkaline Phosphatase: 102 U/L (ref 39–117)
BUN: 18 mg/dL (ref 6–23)
CO2: 28 mEq/L (ref 19–32)
Calcium: 9.5 mg/dL (ref 8.4–10.5)
Chloride: 104 mEq/L (ref 96–112)
Creatinine, Ser: 0.59 mg/dL (ref 0.40–1.20)
GFR: 102.93 mL/min (ref >60.00)
Glucose, Bld: 144 mg/dL — ABNORMAL HIGH (ref 70–99)
Potassium: 4.4 mEq/L (ref 3.5–5.1)
Sodium: 141 mEq/L (ref 135–145)
Total Bilirubin: 0.6 mg/dL (ref 0.2–1.2)
Total Protein: 7.2 g/dL (ref 6.0–8.3)

## 2019-06-07 LAB — HEMOGLOBIN A1C: Hgb A1c MFr Bld: 6.9 % — ABNORMAL HIGH (ref 4.6–6.5)

## 2019-06-10 LAB — HEPATITIS C ANTIBODY
Hepatitis C Ab: NONREACTIVE
SIGNAL TO CUT-OFF: 0.02 (ref <1.00)

## 2019-06-12 ENCOUNTER — Encounter: Payer: Self-pay | Admitting: Primary Care

## 2019-06-12 ENCOUNTER — Ambulatory Visit (INDEPENDENT_AMBULATORY_CARE_PROVIDER_SITE_OTHER): Payer: BLUE CROSS/BLUE SHIELD | Admitting: Primary Care

## 2019-06-12 ENCOUNTER — Other Ambulatory Visit: Payer: Self-pay

## 2019-06-12 VITALS — BP 126/76 | HR 86 | Temp 97.7°F | Ht 63.0 in | Wt 179.5 lb

## 2019-06-12 DIAGNOSIS — Z0000 Encounter for general adult medical examination without abnormal findings: Secondary | ICD-10-CM | POA: Diagnosis not present

## 2019-06-12 DIAGNOSIS — I10 Essential (primary) hypertension: Secondary | ICD-10-CM | POA: Diagnosis not present

## 2019-06-12 DIAGNOSIS — E785 Hyperlipidemia, unspecified: Secondary | ICD-10-CM

## 2019-06-12 DIAGNOSIS — E119 Type 2 diabetes mellitus without complications: Secondary | ICD-10-CM

## 2019-06-12 MED ORDER — ATORVASTATIN CALCIUM 40 MG PO TABS: 40.0000 mg | ORAL_TABLET | Freq: Every evening | ORAL | 3 refills | Status: DC

## 2019-06-12 MED ORDER — LISINOPRIL 10 MG PO TABS: 10.0000 mg | ORAL_TABLET | Freq: Every day | ORAL | 3 refills | Status: DC

## 2019-06-12 NOTE — Assessment & Plan Note (Signed)
Immunizations UTD, declines Shingrix.  Pap smear UTD. Colonoscopy UTD, due in 2023. Encouraged to increase exercise, continue to work on diet. Exam unremarkable. Labs reviewed. Follow up in 1 year for CPE.

## 2019-06-12 NOTE — Assessment & Plan Note (Signed)
LDL slightly above goal, has missed a few doses of her atorvastatin during the weeks. Encouraged daily use. Repeat lipids at next visit.

## 2019-06-12 NOTE — Progress Notes (Signed)
Subjective:    Patient ID: Melissa Baldwin, female    DOB: [birth year], 63 y.o.   MRN: [id number]  HPI  Melissa Baldwin is a 63 year old female who presents today for complete physical.  Immunizations: -Tetanus: Completed in 2013 -Influenza: Due this season  -Pneumonia: Completed in 2019 -Shingles: Declines   Diet: She endorses a poor diet.   Diet currently consists of:  Breakfast: Egg, english muffin Lunch: Sandwich, soup Dinner: Meat, vegetables, starch Snacks: Occasionally cheese and crackers Desserts: Cookie, twice weekly  Beverages: Water, juice, alcohol, unsweet tea, soda  Exercise: She is walking 3 miles daily most days of the week.   Eye exam: Completed three weeks ago Dental exam: Completes annually  Colonoscopy: Completed in 2018, due in 2023 Pap Smear: Completed in 2018 Mammogram: Scheduled for tomorrow  Hep C Screen: Negative in 2020  BP Readings from Last 3 Encounters:  06/12/19 126/76  08/23/18 126/78  02/15/18 130/82     Review of Systems  Constitutional: Negative for unexpected weight change.  HENT: Negative for rhinorrhea.   Respiratory: Negative for cough and shortness of breath.   Cardiovascular: Negative for chest pain.  Gastrointestinal: Negative for constipation and diarrhea.  Genitourinary: Negative for difficulty urinating and menstrual problem.  Musculoskeletal: Negative for arthralgias and myalgias.  Skin: Negative for rash.  Allergic/Immunologic: Negative for environmental allergies.  Neurological: Negative for dizziness, numbness and headaches.  Psychiatric/Behavioral: The patient is not nervous/anxious.        Past Medical History:  Diagnosis Date  . Diabetes mellitus without complication ([place])   . Hyperlipidemia   . Seasonal allergies      Social History   Socioeconomic History  . Marital status: Married    Spouse name: Not on file  . Number of children: Not on file  . Years of education: Not on file  . Highest  education level: Not on file  Occupational History  . Not on file  Social Needs  . Financial resource strain: Not on file  . Food insecurity    Worry: Not on file    Inability: Not on file  . Transportation needs    Medical: Not on file    Non-medical: Not on file  Tobacco Use  . Smoking status: Never Smoker  . Smokeless tobacco: Never Used  Substance and Sexual Activity  . Alcohol use: Yes  . Drug use: No  . Sexual activity: Not on file  Lifestyle  . Physical activity    Days per week: Not on file    Minutes per session: Not on file  . Stress: Not on file  Relationships  . Social Herbalist on phone: Not on file    Gets together: Not on file    Attends religious service: Not on file    Active member of club or organization: Not on file    Attends meetings of clubs or organizations: Not on file    Relationship status: Not on file  . Intimate partner violence    Fear of current or ex partner: Not on file    Emotionally abused: Not on file    Physically abused: Not on file    Forced sexual activity: Not on file  Other Topics Concern  . Not on file  Social History Narrative   Married.   1 child.   Moved from [facility]   Works as a Art therapist.    Past Surgical History:  Procedure Laterality Date  . BREAST BIOPSY  Right    neg  . BREAST CYST ASPIRATION Left   . BREAST LUMPECTOMY Right    fibrocystic ? early 90's  . COLONOSCOPY WITH PROPOFOL N/A 04/27/2017   Procedure: COLONOSCOPY WITH PROPOFOL;  Surgeon: Jonathon Bellows, MD;  Location: [facility] ENDOSCOPY;  Service: Endoscopy;  Laterality: N/A;  . TONSILLECTOMY    . UTERINE FIBROID SURGERY      Family History  Problem Relation Age of Onset  . Alzheimer's disease Mother   . Diabetes Paternal Grandfather   . Breast cancer Neg Hx     Allergies  Allergen Reactions  . Penicillins     Current Outpatient Medications on File Prior to Visit  Medication Sig Dispense Refill  . metFORMIN (GLUCOPHAGE) 500 MG  tablet Take 1 tablet by mouth every morning with food for 2 weeks, then take 1 tablet by mouth twice daily with food thereafter. 180 tablet 1   No current facility-administered medications on file prior to visit.     BP 126/76   Pulse 86   Temp 97.7 F (36.5 C) (Temporal)   Ht 5\' 3"  (1.6 m)   Wt 179 lb 8 oz (81.4 kg)   SpO2 98%   BMI 31.80 kg/m    Objective:   Physical Exam  Constitutional: She is oriented to person, place, and time. She appears well-nourished.  HENT:  Mouth/Throat: No oropharyngeal exudate.  Eyes: Pupils are equal, round, and reactive to light. EOM are normal.  Neck: Neck supple. No thyromegaly present.  Cardiovascular: Normal rate and regular rhythm.  Respiratory: Effort normal and breath sounds normal.  GI: Soft. Bowel sounds are normal. There is no abdominal tenderness.  Musculoskeletal: Normal range of motion.  Neurological: She is alert and oriented to person, place, and time.  Skin: Skin is warm and dry.  Psychiatric: She has a normal mood and affect.           Assessment & Plan:

## 2019-06-12 NOTE — Patient Instructions (Signed)
Continue to work on a healthy diet.  Ensure you are consuming 64 ounces of water daily.  Continue exercising. You should be getting 150 minutes of moderate intensity exercise weekly.  Complete your mammogram as scheduled.  Please schedule a follow up appointment in 6 months for diabetes check.   It was a pleasure to see you today!   Preventive Care 37-63 Years Old, Female Preventive care refers to visits with your health care provider and lifestyle choices that can promote health and wellness. This includes:  A yearly physical exam. This may also be called an annual well check.  Regular dental visits and eye exams.  Immunizations.  Screening for certain conditions.  Healthy lifestyle choices, such as eating a healthy diet, getting regular exercise, not using drugs or products that contain nicotine and tobacco, and limiting alcohol use. What can I expect for my preventive care visit? Physical exam Your health care provider will check your:  Height and weight. This may be used to calculate body mass index (BMI), which tells if you are at a healthy weight.  Heart rate and blood pressure.  Skin for abnormal spots. Counseling Your health care provider may ask you questions about your:  Alcohol, tobacco, and drug use.  Emotional well-being.  Home and relationship well-being.  Sexual activity.  Eating habits.  Work and work Statistician.  Method of birth control.  Menstrual cycle.  Pregnancy history. What immunizations do I need?  Influenza (flu) vaccine  This is recommended every year. Tetanus, diphtheria, and pertussis (Tdap) vaccine  You may need a Td booster every 10 years. Varicella (chickenpox) vaccine  You may need this if you have not been vaccinated. Zoster (shingles) vaccine  You may need this after age 32. Measles, mumps, and rubella (MMR) vaccine  You may need at least one dose of MMR if you were born in 1957 or later. You may also need a  second dose. Pneumococcal conjugate (PCV13) vaccine  You may need this if you have certain conditions and were not previously vaccinated. Pneumococcal polysaccharide (PPSV23) vaccine  You may need one or two doses if you smoke cigarettes or if you have certain conditions. Meningococcal conjugate (MenACWY) vaccine  You may need this if you have certain conditions. Hepatitis A vaccine  You may need this if you have certain conditions or if you travel or work in places where you may be exposed to hepatitis A. Hepatitis B vaccine  You may need this if you have certain conditions or if you travel or work in places where you may be exposed to hepatitis B. Haemophilus influenzae type b (Hib) vaccine  You may need this if you have certain conditions. Human papillomavirus (HPV) vaccine  If recommended by your health care provider, you may need three doses over 6 months. You may receive vaccines as individual doses or as more than one vaccine together in one shot (combination vaccines). Talk with your health care provider about the risks and benefits of combination vaccines. What tests do I need? Blood tests  Lipid and cholesterol levels. These may be checked every 5 years, or more frequently if you are over 65 years old.  Hepatitis C test.  Hepatitis B test. Screening  Lung cancer screening. You may have this screening every year starting at age 23 if you have a 30-pack-year history of smoking and currently smoke or have quit within the past 15 years.  Colorectal cancer screening. All adults should have this screening starting at age 45 and  continuing until age 74. Your health care provider may recommend screening at age 78 if you are at increased risk. You will have tests every 1-10 years, depending on your results and the type of screening test.  Diabetes screening. This is done by checking your blood sugar (glucose) after you have not eaten for a while (fasting). You may have this done  every 1-3 years.  Mammogram. This may be done every 1-2 years. Talk with your health care provider about when you should start having regular mammograms. This may depend on whether you have a family history of breast cancer.  BRCA-related cancer screening. This may be done if you have a family history of breast, ovarian, tubal, or peritoneal cancers.  Pelvic exam and Pap test. This may be done every 3 years starting at age 43. Starting at age 56, this may be done every 5 years if you have a Pap test in combination with an HPV test. Other tests  Sexually transmitted disease (STD) testing.  Bone density scan. This is done to screen for osteoporosis. You may have this scan if you are at high risk for osteoporosis. Follow these instructions at home: Eating and drinking  Eat a diet that includes fresh fruits and vegetables, whole grains, lean protein, and low-fat dairy.  Take vitamin and mineral supplements as recommended by your health care provider.  Do not drink alcohol if: ? Your health care provider tells you not to drink. ? You are pregnant, may be pregnant, or are planning to become pregnant.  If you drink alcohol: ? Limit how much you have to 0-1 drink a day. ? Be aware of how much alcohol is in your drink. In the [place] one drink equals one 12 oz bottle of beer (355 mL), one 5 oz glass of wine (148 mL), or one 1 oz glass of hard liquor (44 mL). Lifestyle  Take daily care of your teeth and gums.  Stay active. Exercise for at least 30 minutes on 5 or more days each week.  Do not use any products that contain nicotine or tobacco, such as cigarettes, e-cigarettes, and chewing tobacco. If you need help quitting, ask your health care provider.  If you are sexually active, practice safe sex. Use a condom or other form of birth control (contraception) in order to prevent pregnancy and STIs (sexually transmitted infections).  If told by your health care provider, take low-dose aspirin  daily starting at age 55. What's next?  Visit your health care provider once a year for a well check visit.  Ask your health care provider how often you should have your eyes and teeth checked.  Stay up to date on all vaccines. This information is not intended to replace advice given to you by your health care provider. Make sure you discuss any questions you have with your health care provider. Document Released: 12/25/2015 Document Revised: 08/09/2018 Document Reviewed: 08/09/2018 Elsevier Patient Education  2020 [employer].

## 2019-06-12 NOTE — Assessment & Plan Note (Signed)
Stable in the office today, continue lisinopril 10 mg.  BMP stable.

## 2019-06-12 NOTE — Assessment & Plan Note (Signed)
Recent A1C at goal below 7. Continue Metformin 500 mg tablets.  Managed on statin and ACE.  Foot exam UTD.  Pneumonia vaccination UTD.  Follow up in 6 months.

## 2019-06-13 ENCOUNTER — Ambulatory Visit
Admission: RE | Admit: 2019-06-13 | Discharge: 2019-06-13 | Disposition: A | Discharge status: Home / Self Care | Payer: BLUE CROSS/BLUE SHIELD | Source: Ambulatory Visit | Attending: Primary Care | Admitting: Primary Care

## 2019-06-13 DIAGNOSIS — Z1231 Encounter for screening mammogram for malignant neoplasm of breast: Secondary | ICD-10-CM | POA: Diagnosis not present

## 2019-06-13 DIAGNOSIS — Z1239 Encounter for other screening for malignant neoplasm of breast: Secondary | ICD-10-CM | POA: Diagnosis present

## 2019-08-30 ENCOUNTER — Ambulatory Visit: Payer: BLUE CROSS/BLUE SHIELD

## 2019-09-04 ENCOUNTER — Ambulatory Visit (INDEPENDENT_AMBULATORY_CARE_PROVIDER_SITE_OTHER): Payer: 59

## 2019-09-04 DIAGNOSIS — Z23 Encounter for immunization: Secondary | ICD-10-CM | POA: Diagnosis not present

## 2019-09-05 NOTE — Addendum Note (Signed)
Addended by: Lurlean Nanny on: 09/05/2019 08:06 AM   Modules accepted: Orders

## 2019-09-05 NOTE — Progress Notes (Signed)
Per orders of Alma Friendly, injection of Flu and Shingrix given by Lurlean Nanny. Patient tolerated injection well. Patient is aware to schedule a nurse visit for the 2nd Shingrix injection

## 2019-11-13 ENCOUNTER — Other Ambulatory Visit: Payer: Self-pay

## 2019-11-13 ENCOUNTER — Ambulatory Visit (INDEPENDENT_AMBULATORY_CARE_PROVIDER_SITE_OTHER): Payer: 59

## 2019-11-13 DIAGNOSIS — Z23 Encounter for immunization: Secondary | ICD-10-CM

## 2019-12-03 NOTE — Telephone Encounter (Signed)
Melissa Baldwin, I am not sure how to help this patient.

## 2019-12-05 ENCOUNTER — Other Ambulatory Visit: Payer: Self-pay | Admitting: Primary Care

## 2019-12-05 DIAGNOSIS — E119 Type 2 diabetes mellitus without complications: Secondary | ICD-10-CM

## 2019-12-05 DIAGNOSIS — E785 Hyperlipidemia, unspecified: Secondary | ICD-10-CM

## 2019-12-08 ENCOUNTER — Other Ambulatory Visit: Payer: Self-pay | Admitting: Primary Care

## 2019-12-08 DIAGNOSIS — E119 Type 2 diabetes mellitus without complications: Secondary | ICD-10-CM

## 2019-12-11 ENCOUNTER — Telehealth: Payer: Self-pay

## 2019-12-11 NOTE — Telephone Encounter (Signed)
LVM w COVID screen, front door and back lab info 12.30.2020 TLJ

## 2019-12-17 ENCOUNTER — Other Ambulatory Visit: Payer: Self-pay

## 2019-12-17 ENCOUNTER — Other Ambulatory Visit (INDEPENDENT_AMBULATORY_CARE_PROVIDER_SITE_OTHER): Payer: BLUE CROSS/BLUE SHIELD

## 2019-12-17 DIAGNOSIS — E785 Hyperlipidemia, unspecified: Secondary | ICD-10-CM

## 2019-12-17 DIAGNOSIS — E119 Type 2 diabetes mellitus without complications: Secondary | ICD-10-CM

## 2019-12-17 LAB — LIPID PANEL
Cholesterol: 162 mg/dL (ref 0–200)
HDL: 52.9 mg/dL (ref >39.00)
LDL Cholesterol: 90 mg/dL (ref 0–99)
NonHDL: 109.18
Total CHOL/HDL Ratio: 3
Triglycerides: 98 mg/dL (ref 0.0–149.0)
VLDL: 19.6 mg/dL (ref 0.0–40.0)

## 2019-12-17 LAB — HEMOGLOBIN A1C: Hgb A1c MFr Bld: 7 % — ABNORMAL HIGH (ref 4.6–6.5)

## 2019-12-19 ENCOUNTER — Other Ambulatory Visit: Payer: Self-pay

## 2019-12-19 ENCOUNTER — Ambulatory Visit (INDEPENDENT_AMBULATORY_CARE_PROVIDER_SITE_OTHER): Payer: BLUE CROSS/BLUE SHIELD | Admitting: Primary Care

## 2019-12-19 DIAGNOSIS — E785 Hyperlipidemia, unspecified: Secondary | ICD-10-CM | POA: Diagnosis not present

## 2019-12-19 DIAGNOSIS — E119 Type 2 diabetes mellitus without complications: Secondary | ICD-10-CM

## 2019-12-19 NOTE — Progress Notes (Signed)
Subjective:    Patient ID: Melissa Baldwin, female    DOB: 09-28-1956, 64 y.o.   MRN: [id number]  HPI  This visit occurred during the SARS-CoV-2 public health emergency.  Safety protocols were in place, including screening questions prior to the visit, additional usage of staff PPE, and extensive cleaning of exam room while observing appropriate contact time as indicated for disinfecting solutions.   Melissa Baldwin is a 64 year old female with a history of hypertension, type 2 diabetes, hyperlipidemia who presents today for follow up of diabetes.  Current medications include: Metformin 500 mg BID.   Last A1C: 6.9 in June 2020, 7.0 in January 2021 Last Eye Exam: Completed in 2020 Last Foot Exam: Due today Pneumonia Vaccination: Completed in 2019 ACE/ARB: Lisinopril  Statin: atorvastatin   BP Readings from Last 3 Encounters:  12/19/19 134/84  06/12/19 126/76  08/23/18 126/78   She has recently been working on a healthier diet, also has started exercising. She denies numbness/tingling, chest pain, dizziness.   Review of Systems  Eyes: Negative for visual disturbance.  Respiratory: Negative for shortness of breath.   Cardiovascular: Negative for chest pain.  Neurological: Negative for dizziness and numbness.       Past Medical History:  Diagnosis Date  . Diabetes mellitus without complication ([place])   . Hyperlipidemia   . Seasonal allergies      Social History   Socioeconomic History  . Marital status: Married    Spouse name: Not on file  . Number of children: Not on file  . Years of education: Not on file  . Highest education level: Not on file  Occupational History  . Not on file  Tobacco Use  . Smoking status: Never Smoker  . Smokeless tobacco: Never Used  Substance and Sexual Activity  . Alcohol use: Yes  . Drug use: No  . Sexual activity: Not on file  Other Topics Concern  . Not on file  Social History Narrative   Married.   1 child.   Moved from  [facility]   Works as a Art therapist.   Social Determinants of Health   Financial Resource Strain:   . Difficulty of Paying Living Expenses: Not on file  Food Insecurity:   . Worried About Charity fundraiser in the Last Year: Not on file  . Ran Out of Food in the Last Year: Not on file  Transportation Needs:   . Lack of Transportation (Medical): Not on file  . Lack of Transportation (Non-Medical): Not on file  Physical Activity:   . Days of Exercise per Week: Not on file  . Minutes of Exercise per Session: Not on file  Stress:   . Feeling of Stress : Not on file  Social Connections:   . Frequency of Communication with Friends and Family: Not on file  . Frequency of Social Gatherings with Friends and Family: Not on file  . Attends Religious Services: Not on file  . Active Member of Clubs or Organizations: Not on file  . Attends Archivist Meetings: Not on file  . Marital Status: Not on file  Intimate Partner Violence:   . Fear of Current or Ex-Partner: Not on file  . Emotionally Abused: Not on file  . Physically Abused: Not on file  . Sexually Abused: Not on file    Past Surgical History:  Procedure Laterality Date  . BREAST BIOPSY Right    neg  . BREAST CYST ASPIRATION Left   .  BREAST EXCISIONAL BIOPSY    . BREAST LUMPECTOMY Right    fibrocystic ? early 90's  . COLONOSCOPY WITH PROPOFOL N/A 04/27/2017   Procedure: COLONOSCOPY WITH PROPOFOL;  Surgeon: Jonathon Bellows, MD;  Location: [facility] ENDOSCOPY;  Service: Endoscopy;  Laterality: N/A;  . TONSILLECTOMY    . UTERINE FIBROID SURGERY      Family History  Problem Relation Age of Onset  . Alzheimer's disease Mother   . Diabetes Paternal Grandfather   . Breast cancer Neg Hx     Allergies  Allergen Reactions  . Penicillins     Current Outpatient Medications on File Prior to Visit  Medication Sig Dispense Refill  . atorvastatin (LIPITOR) 40 MG tablet Take 1 tablet (40 mg total) by mouth every evening. For  cholesterol. 90 tablet 3  . lisinopril (ZESTRIL) 10 MG tablet Take 1 tablet (10 mg total) by mouth daily. For blood pressure. 90 tablet 3  . metFORMIN (GLUCOPHAGE) 500 MG tablet TAKE 1 TABLET EVERY MORNING W/ FOOD FOR 2 WEEKS, THEN TAKE 1 TABLET TWICE DAILY W/ FOOD THEREAFTER. 60 tablet 0   No current facility-administered medications on file prior to visit.    BP 134/84   Pulse 82   Temp (!) 96.5 F (35.8 C) (Temporal)   Ht 5\' 3"  (1.6 m)   Wt 181 lb 8 oz (82.3 kg)   SpO2 98%   BMI 32.15 kg/m    Objective:   Physical Exam  Constitutional: She appears well-nourished.  Cardiovascular: Normal rate and regular rhythm.  Respiratory: Effort normal and breath sounds normal.  Musculoskeletal:     Cervical back: Neck supple.  Skin: Skin is warm and dry.  Psychiatric: She has a normal mood and affect.           Assessment & Plan:

## 2019-12-19 NOTE — Patient Instructions (Addendum)
Continue exercising. You should be getting 150 minutes of moderate intensity exercise weekly.  It is important that you improve your diet. Please limit carbohydrates in the form of white bread, rice, pasta, sweets, fast food, fried food, sugary drinks, etc. Increase your consumption of fresh fruits and vegetables, whole grains, lean protein.  Ensure you are consuming 64 ounces of water daily.  Continue Metformin 500 mg twice daily.  Please schedule a physical with me for 6 months.   It was a pleasure to see you today!

## 2019-12-19 NOTE — Assessment & Plan Note (Signed)
A1C today of 7.0 which is slightly worse but still under decent control. She will work on lifestyle changes in order to get her A1C below 7.0.  Continue Metformin 500 mg BID. Managed on statin and ACE. Pneumonia vaccination UTD. Foot exam today. Eye exam UTD.  Follow up in 6 months.

## 2019-12-19 NOTE — Assessment & Plan Note (Signed)
Recent labs stable with LDL at goal, continue atorvastatin.

## 2020-03-30 ENCOUNTER — Other Ambulatory Visit: Payer: Self-pay | Admitting: Primary Care

## 2020-03-30 DIAGNOSIS — E119 Type 2 diabetes mellitus without complications: Secondary | ICD-10-CM

## 2020-04-26 DIAGNOSIS — Z1231 Encounter for screening mammogram for malignant neoplasm of breast: Secondary | ICD-10-CM

## 2020-05-06 LAB — HM DIABETES EYE EXAM

## 2020-05-08 ENCOUNTER — Encounter: Payer: Self-pay | Admitting: Primary Care

## 2020-05-29 ENCOUNTER — Other Ambulatory Visit: Payer: Self-pay | Admitting: Primary Care

## 2020-05-29 DIAGNOSIS — E119 Type 2 diabetes mellitus without complications: Secondary | ICD-10-CM

## 2020-05-29 DIAGNOSIS — I10 Essential (primary) hypertension: Secondary | ICD-10-CM

## 2020-05-29 DIAGNOSIS — Z114 Encounter for screening for human immunodeficiency virus [HIV]: Secondary | ICD-10-CM

## 2020-05-29 DIAGNOSIS — E785 Hyperlipidemia, unspecified: Secondary | ICD-10-CM

## 2020-06-02 ENCOUNTER — Other Ambulatory Visit: Payer: Self-pay | Admitting: Primary Care

## 2020-06-02 DIAGNOSIS — I10 Essential (primary) hypertension: Secondary | ICD-10-CM

## 2020-06-02 DIAGNOSIS — E785 Hyperlipidemia, unspecified: Secondary | ICD-10-CM

## 2020-06-12 ENCOUNTER — Other Ambulatory Visit (INDEPENDENT_AMBULATORY_CARE_PROVIDER_SITE_OTHER): Payer: BLUE CROSS/BLUE SHIELD

## 2020-06-12 ENCOUNTER — Other Ambulatory Visit: Payer: Self-pay

## 2020-06-12 DIAGNOSIS — E119 Type 2 diabetes mellitus without complications: Secondary | ICD-10-CM

## 2020-06-12 DIAGNOSIS — E785 Hyperlipidemia, unspecified: Secondary | ICD-10-CM

## 2020-06-12 DIAGNOSIS — I10 Essential (primary) hypertension: Secondary | ICD-10-CM | POA: Diagnosis not present

## 2020-06-12 DIAGNOSIS — Z114 Encounter for screening for human immunodeficiency virus [HIV]: Secondary | ICD-10-CM

## 2020-06-12 LAB — COMPREHENSIVE METABOLIC PANEL
ALT: 15 U/L (ref 0–35)
AST: 13 U/L (ref 0–37)
Albumin: 4.5 g/dL (ref 3.5–5.2)
Alkaline Phosphatase: 96 U/L (ref 39–117)
BUN: 20 mg/dL (ref 6–23)
CO2: 28 mEq/L (ref 19–32)
Calcium: 9.7 mg/dL (ref 8.4–10.5)
Chloride: 102 mEq/L (ref 96–112)
Creatinine, Ser: 0.61 mg/dL (ref 0.40–1.20)
GFR: 98.73 mL/min (ref >60.00)
Glucose, Bld: 140 mg/dL — ABNORMAL HIGH (ref 70–99)
Potassium: 4 mEq/L (ref 3.5–5.1)
Sodium: 138 mEq/L (ref 135–145)
Total Bilirubin: 0.5 mg/dL (ref 0.2–1.2)
Total Protein: 7.6 g/dL (ref 6.0–8.3)

## 2020-06-12 LAB — CBC
HCT: 38.9 % (ref 36.0–46.0)
Hemoglobin: 13.1 g/dL (ref 12.0–15.0)
MCHC: 33.5 g/dL (ref 30.0–36.0)
MCV: 87.2 fl (ref 78.0–100.0)
Platelets: 358 10*3/uL (ref 150.0–400.0)
RBC: 4.46 Mil/uL (ref 3.87–5.11)
RDW: 13.6 % (ref 11.5–15.5)
WBC: 10.3 10*3/uL (ref 4.0–10.5)

## 2020-06-12 LAB — LIPID PANEL
Cholesterol: 171 mg/dL (ref 0–200)
HDL: 52.1 mg/dL (ref >39.00)
LDL Cholesterol: 92 mg/dL (ref 0–99)
NonHDL: 118.52
Total CHOL/HDL Ratio: 3
Triglycerides: 131 mg/dL (ref 0.0–149.0)
VLDL: 26.2 mg/dL (ref 0.0–40.0)

## 2020-06-12 LAB — HEMOGLOBIN A1C: Hgb A1c MFr Bld: 7.1 % — ABNORMAL HIGH (ref 4.6–6.5)

## 2020-06-13 LAB — HIV ANTIBODY (ROUTINE TESTING W REFLEX): HIV 1&2 Ab, 4th Generation: NONREACTIVE

## 2020-06-16 ENCOUNTER — Ambulatory Visit
Admission: RE | Admit: 2020-06-16 | Discharge: 2020-06-16 | Disposition: A | Discharge status: Home / Self Care | Payer: BLUE CROSS/BLUE SHIELD | Source: Ambulatory Visit | Attending: Primary Care | Admitting: Primary Care

## 2020-06-16 DIAGNOSIS — Z1231 Encounter for screening mammogram for malignant neoplasm of breast: Secondary | ICD-10-CM | POA: Insufficient documentation

## 2020-06-17 ENCOUNTER — Other Ambulatory Visit (HOSPITAL_COMMUNITY)
Admission: RE | Admit: 2020-06-17 | Discharge: 2020-06-17 | Disposition: A | Discharge status: Home / Self Care | Payer: BLUE CROSS/BLUE SHIELD | Source: Ambulatory Visit | Attending: Primary Care | Admitting: Primary Care

## 2020-06-17 ENCOUNTER — Other Ambulatory Visit: Payer: Self-pay

## 2020-06-17 ENCOUNTER — Ambulatory Visit (INDEPENDENT_AMBULATORY_CARE_PROVIDER_SITE_OTHER): Payer: BLUE CROSS/BLUE SHIELD | Admitting: Primary Care

## 2020-06-17 ENCOUNTER — Encounter: Payer: Self-pay | Admitting: Primary Care

## 2020-06-17 VITALS — BP 124/82 | HR 86 | Temp 96.2°F | Ht 63.0 in | Wt 177.2 lb

## 2020-06-17 DIAGNOSIS — Z124 Encounter for screening for malignant neoplasm of cervix: Secondary | ICD-10-CM | POA: Insufficient documentation

## 2020-06-17 DIAGNOSIS — I10 Essential (primary) hypertension: Secondary | ICD-10-CM | POA: Diagnosis not present

## 2020-06-17 DIAGNOSIS — Z0000 Encounter for general adult medical examination without abnormal findings: Secondary | ICD-10-CM | POA: Diagnosis not present

## 2020-06-17 DIAGNOSIS — E119 Type 2 diabetes mellitus without complications: Secondary | ICD-10-CM

## 2020-06-17 DIAGNOSIS — E785 Hyperlipidemia, unspecified: Secondary | ICD-10-CM

## 2020-06-17 NOTE — Assessment & Plan Note (Signed)
Well controlled in the office today, continue lisinopril. CMP reviewed and UTD.

## 2020-06-17 NOTE — Patient Instructions (Signed)
Start exercising. You should be getting 150 minutes of moderate intensity exercise weekly.  Continue to work on a healthy diet. Ensure you are consuming 64 ounces of water daily.  Please schedule a follow up appointment in 6 months for a diabetes check.   It was a pleasure to see you today!   Preventive Care 32-64 Years Old, Female Preventive care refers to visits with your health care provider and lifestyle choices that can promote health and wellness. This includes:  A yearly physical exam. This may also be called an annual well check.  Regular dental visits and eye exams.  Immunizations.  Screening for certain conditions.  Healthy lifestyle choices, such as eating a healthy diet, getting regular exercise, not using drugs or products that contain nicotine and tobacco, and limiting alcohol use. What can I expect for my preventive care visit? Physical exam Your health care provider will check your:  Height and weight. This may be used to calculate body mass index (BMI), which tells if you are at a healthy weight.  Heart rate and blood pressure.  Skin for abnormal spots. Counseling Your health care provider may ask you questions about your:  Alcohol, tobacco, and drug use.  Emotional well-being.  Home and relationship well-being.  Sexual activity.  Eating habits.  Work and work Statistician.  Method of birth control.  Menstrual cycle.  Pregnancy history. What immunizations do I need?  Influenza (flu) vaccine  This is recommended every year. Tetanus, diphtheria, and pertussis (Tdap) vaccine  You may need a Td booster every 10 years. Varicella (chickenpox) vaccine  You may need this if you have not been vaccinated. Zoster (shingles) vaccine  You may need this after age 20. Measles, mumps, and rubella (MMR) vaccine  You may need at least one dose of MMR if you were born in 1957 or later. You may also need a second dose. Pneumococcal conjugate (PCV13)  vaccine  You may need this if you have certain conditions and were not previously vaccinated. Pneumococcal polysaccharide (PPSV23) vaccine  You may need one or two doses if you smoke cigarettes or if you have certain conditions. Meningococcal conjugate (MenACWY) vaccine  You may need this if you have certain conditions. Hepatitis A vaccine  You may need this if you have certain conditions or if you travel or work in places where you may be exposed to hepatitis A. Hepatitis B vaccine  You may need this if you have certain conditions or if you travel or work in places where you may be exposed to hepatitis B. Haemophilus influenzae type b (Hib) vaccine  You may need this if you have certain conditions. Human papillomavirus (HPV) vaccine  If recommended by your health care provider, you may need three doses over 6 months. You may receive vaccines as individual doses or as more than one vaccine together in one shot (combination vaccines). Talk with your health care provider about the risks and benefits of combination vaccines. What tests do I need? Blood tests  Lipid and cholesterol levels. These may be checked every 5 years, or more frequently if you are over 38 years old.  Hepatitis C test.  Hepatitis B test. Screening  Lung cancer screening. You may have this screening every year starting at age 80 if you have a 30-pack-year history of smoking and currently smoke or have quit within the past 15 years.  Colorectal cancer screening. All adults should have this screening starting at age 65 and continuing until age 58. Your health  care provider may recommend screening at age 31 if you are at increased risk. You will have tests every 1-10 years, depending on your results and the type of screening test.  Diabetes screening. This is done by checking your blood sugar (glucose) after you have not eaten for a while (fasting). You may have this done every 1-3 years.  Mammogram. This may be  done every 1-2 years. Talk with your health care provider about when you should start having regular mammograms. This may depend on whether you have a family history of breast cancer.  BRCA-related cancer screening. This may be done if you have a family history of breast, ovarian, tubal, or peritoneal cancers.  Pelvic exam and Pap test. This may be done every 3 years starting at age 23. Starting at age 48, this may be done every 5 years if you have a Pap test in combination with an HPV test. Other tests  Sexually transmitted disease (STD) testing.  Bone density scan. This is done to screen for osteoporosis. You may have this scan if you are at high risk for osteoporosis. Follow these instructions at home: Eating and drinking  Eat a diet that includes fresh fruits and vegetables, whole grains, lean protein, and low-fat dairy.  Take vitamin and mineral supplements as recommended by your health care provider.  Do not drink alcohol if: ? Your health care provider tells you not to drink. ? You are pregnant, may be pregnant, or are planning to become pregnant.  If you drink alcohol: ? Limit how much you have to 0-1 drink a day. ? Be aware of how much alcohol is in your drink. In the [place] one drink equals one 12 oz bottle of beer (355 mL), one 5 oz glass of wine (148 mL), or one 1 oz glass of hard liquor (44 mL). Lifestyle  Take daily care of your teeth and gums.  Stay active. Exercise for at least 30 minutes on 5 or more days each week.  Do not use any products that contain nicotine or tobacco, such as cigarettes, e-cigarettes, and chewing tobacco. If you need help quitting, ask your health care provider.  If you are sexually active, practice safe sex. Use a condom or other form of birth control (contraception) in order to prevent pregnancy and STIs (sexually transmitted infections).  If told by your health care provider, take low-dose aspirin daily starting at age 57. What's  next?  Visit your health care provider once a year for a well check visit.  Ask your health care provider how often you should have your eyes and teeth checked.  Stay up to date on all vaccines. This information is not intended to replace advice given to you by your health care provider. Make sure you discuss any questions you have with your health care provider. Document Revised: 08/09/2018 Document Reviewed: 08/09/2018 Elsevier Patient Education  2020 [employer].

## 2020-06-17 NOTE — Assessment & Plan Note (Signed)
Well controlled on atorvastatin, continue same.  

## 2020-06-17 NOTE — Assessment & Plan Note (Signed)
A1C of 7.1 which is stable and under good control. Encouraged regular exercise, healthy diet. Continue metformin.  Managed on statin and ACE-I. Pneumonia vaccination UTD. Eye exam UTD.  Follow up in 6 months.

## 2020-06-17 NOTE — Progress Notes (Addendum)
Subjective:    Patient ID: Melissa Baldwin, female    DOB: 09/22/1956, 64 y.o.   MRN: [id number]  HPI  This visit occurred during the SARS-CoV-2 public health emergency.  Safety protocols were in place, including screening questions prior to the visit, additional usage of staff PPE, and extensive cleaning of exam room while observing appropriate contact time as indicated for disinfecting solutions.   Melissa Baldwin is a 64 year old female who presents today for complete physical.  Immunizations: -Tetanus: Completed in 2013 -Influenza: Completed last season  -Shingles: Completed series  -Pneumonia: Completed in 2019 -Covid-19: Completed series   Diet: She endorses a fair diet.  Exercise: She is active, walking 3 miles daily total.   Eye exam: UTD Dental exam: Completes semi-annually   Pap Smear: Completed in 2018 Mammogram: Completed in 2021 Colonoscopy: Completed in 2018, due in 2023 Hep C Screen: Negative   BP Readings from Last 3 Encounters:  06/17/20 124/82  12/19/19 134/84  06/12/19 126/76     Review of Systems  Constitutional: Negative for unexpected weight change.  HENT: Negative for rhinorrhea.   Respiratory: Negative for cough and shortness of breath.   Cardiovascular: Negative for chest pain.  Gastrointestinal: Negative for constipation and diarrhea.  Genitourinary: Negative for difficulty urinating.  Musculoskeletal: Negative for arthralgias and myalgias.  Skin: Negative for rash.  Allergic/Immunologic: Negative for environmental allergies.  Neurological: Negative for dizziness, numbness and headaches.  Psychiatric/Behavioral: The patient is not nervous/anxious.        Past Medical History:  Diagnosis Date  . Diabetes mellitus without complication ([place])   . Hyperlipidemia   . Seasonal allergies      Social History   Socioeconomic History  . Marital status: Married    Spouse name: Not on file  . Number of children: Not on file  . Years of  education: Not on file  . Highest education level: Not on file  Occupational History  . Not on file  Tobacco Use  . Smoking status: Never Smoker  . Smokeless tobacco: Never Used  Substance and Sexual Activity  . Alcohol use: Yes  . Drug use: No  . Sexual activity: Not on file  Other Topics Concern  . Not on file  Social History Narrative   Married.   1 child.   Moved from [facility]   Works as a Art therapist.   Social Determinants of Health   Financial Resource Strain:   . Difficulty of Paying Living Expenses:   Food Insecurity:   . Worried About Charity fundraiser in the Last Year:   . Arboriculturist in the Last Year:   Transportation Needs:   . Film/video editor (Medical):   Marland Kitchen Lack of Transportation (Non-Medical):   Physical Activity:   . Days of Exercise per Week:   . Minutes of Exercise per Session:   Stress:   . Feeling of Stress :   Social Connections:   . Frequency of Communication with Friends and Family:   . Frequency of Social Gatherings with Friends and Family:   . Attends Religious Services:   . Active Member of Clubs or Organizations:   . Attends Archivist Meetings:   Marland Kitchen Marital Status:   Intimate Partner Violence:   . Fear of Current or Ex-Partner:   . Emotionally Abused:   Marland Kitchen Physically Abused:   . Sexually Abused:     Past Surgical History:  Procedure Laterality Date  . BREAST BIOPSY Right  neg  . BREAST CYST ASPIRATION Left   . BREAST EXCISIONAL BIOPSY    . BREAST LUMPECTOMY Right    fibrocystic ? early 90's  . COLONOSCOPY WITH PROPOFOL N/A 04/27/2017   Procedure: COLONOSCOPY WITH PROPOFOL;  Surgeon: Jonathon Bellows, MD;  Location: [facility] ENDOSCOPY;  Service: Endoscopy;  Laterality: N/A;  . TONSILLECTOMY    . UTERINE FIBROID SURGERY      Family History  Problem Relation Age of Onset  . Alzheimer's disease Mother   . Diabetes Paternal Grandfather   . Breast cancer Neg Hx     Allergies  Allergen Reactions  . Penicillins      Current Outpatient Medications on File Prior to Visit  Medication Sig Dispense Refill  . atorvastatin (LIPITOR) 40 MG tablet TAKE 1 TABLET (40 MG TOTAL) BY MOUTH EVERY EVENING. FOR CHOLESTEROL. 90 tablet 3  . lisinopril (ZESTRIL) 10 MG tablet TAKE 1 TABLET BY MOUTH EVERY DAY FOR BLOOD PRESSURE 90 tablet 3  . metFORMIN (GLUCOPHAGE) 500 MG tablet Take 1 tablet (500 mg total) by mouth 2 (two) times daily with a meal. 180 tablet 1   No current facility-administered medications on file prior to visit.    BP 124/82   Pulse 86   Temp (!) 96.2 F (35.7 C) (Temporal)   Ht 5\' 3"  (1.6 m)   Wt 177 lb 4 oz (80.4 kg)   SpO2 98%   BMI 31.40 kg/m    Objective:   Physical Exam HENT:     Right Ear: Tympanic membrane and ear canal normal.     Left Ear: Tympanic membrane and ear canal normal.  Eyes:     Pupils: Pupils are equal, round, and reactive to light.  Cardiovascular:     Rate and Rhythm: Normal rate and regular rhythm.  Pulmonary:     Effort: Pulmonary effort is normal.     Breath sounds: Normal breath sounds.  Abdominal:     General: Bowel sounds are normal.     Palpations: Abdomen is soft.     Tenderness: There is no abdominal tenderness.  Genitourinary:    Labia:        Right: No lesion.        Left: No lesion.      Vagina: Normal.     Cervix: No cervical motion tenderness, discharge or erythema.     Adnexa: Right adnexa normal and left adnexa normal.  Musculoskeletal:        General: Normal range of motion.     Cervical back: Neck supple.  Skin:    General: Skin is warm and dry.  Neurological:     Mental Status: She is alert and oriented to person, place, and time.     Cranial Nerves: No cranial nerve deficit.     Deep Tendon Reflexes:     Reflex Scores:      Patellar reflexes are 2+ on the right side and 2+ on the left side.           Assessment & Plan:

## 2020-06-17 NOTE — Assessment & Plan Note (Signed)
Immunizations UTD. Pap smear due, completed today. Mammogram UTD. Colonoscopy UTD, due in 2023.  Discussed the importance of a healthy diet and regular exercise in order for weight loss, and to reduce the risk of any potential medical problems.  Exam today unremarkable. Labs reviewed.

## 2020-06-18 LAB — CYTOLOGY - PAP
Comment: NEGATIVE
Diagnosis: NEGATIVE
High risk HPV: NEGATIVE

## 2020-07-16 ENCOUNTER — Telehealth: Payer: Self-pay | Admitting: Primary Care

## 2020-07-16 NOTE — Telephone Encounter (Signed)
Patient's husband called in stating he would like to update in the records, as well as [facility] database, that they have received the COVID vaccine. Patient believes it was the moderna vaccine. 1st dose was March 17th and 2nd was April 14th.

## 2020-07-17 NOTE — Telephone Encounter (Signed)
Chart updated

## 2020-09-23 ENCOUNTER — Ambulatory Visit (INDEPENDENT_AMBULATORY_CARE_PROVIDER_SITE_OTHER)

## 2020-09-23 ENCOUNTER — Other Ambulatory Visit: Payer: Self-pay

## 2020-09-23 DIAGNOSIS — Z23 Encounter for immunization: Secondary | ICD-10-CM

## 2020-12-03 ENCOUNTER — Other Ambulatory Visit: Payer: Self-pay

## 2020-12-03 DIAGNOSIS — I10 Essential (primary) hypertension: Secondary | ICD-10-CM

## 2020-12-03 DIAGNOSIS — E785 Hyperlipidemia, unspecified: Secondary | ICD-10-CM

## 2020-12-03 DIAGNOSIS — E119 Type 2 diabetes mellitus without complications: Secondary | ICD-10-CM

## 2020-12-03 MED ORDER — LISINOPRIL 10 MG PO TABS: ORAL_TABLET | ORAL | 0 refills | Status: DC

## 2020-12-03 MED ORDER — METFORMIN HCL 500 MG PO TABS: 500.0000 mg | ORAL_TABLET | Freq: Two times a day (BID) | ORAL | 0 refills | Status: DC

## 2020-12-03 MED ORDER — ATORVASTATIN CALCIUM 40 MG PO TABS: 40.0000 mg | ORAL_TABLET | Freq: Every evening | ORAL | 0 refills | Status: DC

## 2020-12-10 ENCOUNTER — Other Ambulatory Visit: Payer: Self-pay

## 2020-12-10 DIAGNOSIS — I10 Essential (primary) hypertension: Secondary | ICD-10-CM

## 2020-12-10 MED ORDER — LISINOPRIL 10 MG PO TABS: ORAL_TABLET | ORAL | 0 refills | Status: DC

## 2020-12-15 ENCOUNTER — Other Ambulatory Visit: Payer: Self-pay

## 2020-12-15 DIAGNOSIS — I10 Essential (primary) hypertension: Secondary | ICD-10-CM

## 2020-12-15 DIAGNOSIS — E785 Hyperlipidemia, unspecified: Secondary | ICD-10-CM

## 2020-12-15 DIAGNOSIS — E119 Type 2 diabetes mellitus without complications: Secondary | ICD-10-CM

## 2020-12-15 MED ORDER — METFORMIN HCL 500 MG PO TABS: 500.0000 mg | ORAL_TABLET | Freq: Two times a day (BID) | ORAL | 0 refills | Status: DC

## 2020-12-15 MED ORDER — ATORVASTATIN CALCIUM 40 MG PO TABS: 40.0000 mg | ORAL_TABLET | Freq: Every evening | ORAL | 0 refills | Status: DC

## 2020-12-15 MED ORDER — LISINOPRIL 10 MG PO TABS: ORAL_TABLET | ORAL | 0 refills | Status: DC

## 2020-12-21 ENCOUNTER — Ambulatory Visit: Payer: BLUE CROSS/BLUE SHIELD | Admitting: Primary Care

## 2021-01-13 ENCOUNTER — Other Ambulatory Visit: Payer: Self-pay

## 2021-01-13 ENCOUNTER — Encounter: Payer: Self-pay | Admitting: Primary Care

## 2021-01-13 ENCOUNTER — Ambulatory Visit (INDEPENDENT_AMBULATORY_CARE_PROVIDER_SITE_OTHER): Admitting: Primary Care

## 2021-01-13 VITALS — BP 130/82 | HR 103 | Temp 97.8°F | Ht 63.0 in | Wt 178.0 lb

## 2021-01-13 DIAGNOSIS — E119 Type 2 diabetes mellitus without complications: Secondary | ICD-10-CM

## 2021-01-13 LAB — POCT GLYCOSYLATED HEMOGLOBIN (HGB A1C): Hemoglobin A1C: 6.7 % — AB (ref 4.0–5.6)

## 2021-01-13 NOTE — Progress Notes (Signed)
Subjective:    Patient ID: Melissa Baldwin, female    DOB: [birth year], 65 y.o.   MRN: [id number]  HPI  This visit occurred during the SARS-CoV-2 public health emergency.  Safety protocols were in place, including screening questions prior to the visit, additional usage of staff PPE, and extensive cleaning of exam room while observing appropriate contact time as indicated for disinfecting solutions.   Melissa Baldwin is a 65 year old female with a history of type 2 diabetes, hypertension, hyperlipidemia who presents today for follow up of diabetes.  Current medications include: Metformin 500 mg BID  She is checking her blood glucose 0 times daily.   Last A1C: 7.1 in July 2021, 6.7 today Last Eye Exam: UTD Last Foot Exam: Due Pneumonia Vaccination: UTD ACE/ARB: lisinopril  Statin: atorvastatin   BP Readings from Last 3 Encounters:  01/13/21 130/82  06/17/20 124/82  12/19/19 134/84   Wt Readings from Last 3 Encounters:  01/13/21 178 lb (80.7 kg)  06/17/20 177 lb 4 oz (80.4 kg)  12/19/19 181 lb 8 oz (82.3 kg)     Review of Systems  Eyes: Negative for visual disturbance.  Respiratory: Negative for shortness of breath.   Cardiovascular: Negative for chest pain.  Neurological: Negative for dizziness and numbness.       Past Medical History:  Diagnosis Date  . Diabetes mellitus without complication ([place])   . Hyperlipidemia   . Seasonal allergies      Social History   Socioeconomic History  . Marital status: Married    Spouse name: Not on file  . Number of children: Not on file  . Years of education: Not on file  . Highest education level: Not on file  Occupational History  . Not on file  Tobacco Use  . Smoking status: Never Smoker  . Smokeless tobacco: Never Used  Substance and Sexual Activity  . Alcohol use: Yes  . Drug use: No  . Sexual activity: Not on file  Other Topics Concern  . Not on file  Social History Narrative   Married.   1 child.   Moved  from [facility]   Works as a Art therapist.   Social Determinants of Health   Financial Resource Strain: Not on file  Food Insecurity: Not on file  Transportation Needs: Not on file  Physical Activity: Not on file  Stress: Not on file  Social Connections: Not on file  Intimate Partner Violence: Not on file    Past Surgical History:  Procedure Laterality Date  . BREAST BIOPSY Right    neg  . BREAST CYST ASPIRATION Left   . BREAST EXCISIONAL BIOPSY    . BREAST LUMPECTOMY Right    fibrocystic ? early 90's  . COLONOSCOPY WITH PROPOFOL N/A 04/27/2017   Procedure: COLONOSCOPY WITH PROPOFOL;  Surgeon: Jonathon Bellows, MD;  Location: [facility] ENDOSCOPY;  Service: Endoscopy;  Laterality: N/A;  . TONSILLECTOMY    . UTERINE FIBROID SURGERY      Family History  Problem Relation Age of Onset  . Alzheimer's disease Mother   . Diabetes Paternal Grandfather   . Breast cancer Neg Hx     Allergies  Allergen Reactions  . Penicillins     Current Outpatient Medications on File Prior to Visit  Medication Sig Dispense Refill  . atorvastatin (LIPITOR) 40 MG tablet Take 1 tablet (40 mg total) by mouth every evening. For cholesterol. 90 tablet 0  . lisinopril (ZESTRIL) 10 MG tablet TAKE 1 TABLET BY MOUTH EVERY  DAY FOR BLOOD PRESSURE 90 tablet 0  . metFORMIN (GLUCOPHAGE) 500 MG tablet Take 1 tablet (500 mg total) by mouth 2 (two) times daily with a meal. 180 tablet 0   No current facility-administered medications on file prior to visit.    BP 130/82 (BP Location: Left Arm, Patient Position: Sitting, Cuff Size: Normal)   Pulse (!) 103   Temp 97.8 F (36.6 C) (Temporal)   Ht 5\' 3"  (1.6 m)   Wt 178 lb (80.7 kg)   SpO2 97%   BMI 31.53 kg/m    Objective:   Physical Exam Constitutional:      Appearance: She is well-nourished.  Cardiovascular:     Rate and Rhythm: Normal rate and regular rhythm.  Pulmonary:     Effort: Pulmonary effort is normal.     Breath sounds: Normal breath sounds.   Musculoskeletal:     Cervical back: Neck supple.  Skin:    General: Skin is warm and dry.  Psychiatric:        Mood and Affect: Mood and affect normal.            Assessment & Plan:

## 2021-01-13 NOTE — Assessment & Plan Note (Signed)
Compliant to Metformin 500 mg BID. A1C today of 6.7! Commended her on this!  Encouraged regular exercise, healthy diet.   Foot exam today. Eye exam UTD. Pneumonia vaccine UTD. Managed on statin and ACE  Follow up in 6 months.

## 2021-01-13 NOTE — Patient Instructions (Addendum)
Continue exercising. You should be getting 150 minutes of moderate intensity exercise weekly.  Continue to work on a healthy diet. Ensure you are consuming 64 ounces of water daily.  Please schedule a physical to meet with me in 6 months. You may also schedule a lab only appointment 3-4 days prior, but this is not required. We will discuss your lab results in detail during your physical.  It was a pleasure to see you today!

## 2021-03-29 ENCOUNTER — Other Ambulatory Visit: Payer: Self-pay

## 2021-03-29 DIAGNOSIS — E119 Type 2 diabetes mellitus without complications: Secondary | ICD-10-CM

## 2021-03-29 DIAGNOSIS — E785 Hyperlipidemia, unspecified: Secondary | ICD-10-CM

## 2021-03-29 DIAGNOSIS — I10 Essential (primary) hypertension: Secondary | ICD-10-CM

## 2021-03-29 MED ORDER — METFORMIN HCL 500 MG PO TABS: 500.0000 mg | ORAL_TABLET | Freq: Two times a day (BID) | ORAL | 0 refills | Status: DC

## 2021-03-29 MED ORDER — LISINOPRIL 10 MG PO TABS: ORAL_TABLET | ORAL | 0 refills | Status: DC

## 2021-03-29 MED ORDER — ATORVASTATIN CALCIUM 40 MG PO TABS: 40.0000 mg | ORAL_TABLET | Freq: Every evening | ORAL | 0 refills | Status: DC

## 2021-05-03 ENCOUNTER — Other Ambulatory Visit: Payer: Self-pay | Admitting: Primary Care

## 2021-05-03 DIAGNOSIS — Z1231 Encounter for screening mammogram for malignant neoplasm of breast: Secondary | ICD-10-CM

## 2021-05-03 LAB — HM DIABETES EYE EXAM

## 2021-06-18 ENCOUNTER — Ambulatory Visit
Admission: RE | Admit: 2021-06-18 | Discharge: 2021-06-18 | Disposition: A | Discharge status: Home / Self Care | Source: Ambulatory Visit | Attending: Primary Care | Admitting: Primary Care

## 2021-06-18 ENCOUNTER — Other Ambulatory Visit: Payer: Self-pay

## 2021-06-18 DIAGNOSIS — Z1231 Encounter for screening mammogram for malignant neoplasm of breast: Secondary | ICD-10-CM | POA: Insufficient documentation

## 2021-06-26 ENCOUNTER — Other Ambulatory Visit: Payer: Self-pay | Admitting: Primary Care

## 2021-06-26 DIAGNOSIS — E119 Type 2 diabetes mellitus without complications: Secondary | ICD-10-CM

## 2021-06-26 DIAGNOSIS — E785 Hyperlipidemia, unspecified: Secondary | ICD-10-CM

## 2021-06-30 ENCOUNTER — Other Ambulatory Visit: Payer: Self-pay | Admitting: Primary Care

## 2021-06-30 DIAGNOSIS — E119 Type 2 diabetes mellitus without complications: Secondary | ICD-10-CM

## 2021-06-30 DIAGNOSIS — E785 Hyperlipidemia, unspecified: Secondary | ICD-10-CM

## 2021-06-30 DIAGNOSIS — I10 Essential (primary) hypertension: Secondary | ICD-10-CM

## 2021-07-12 ENCOUNTER — Other Ambulatory Visit

## 2021-07-15 ENCOUNTER — Other Ambulatory Visit: Payer: Self-pay

## 2021-07-15 ENCOUNTER — Encounter: Payer: Self-pay | Admitting: Primary Care

## 2021-07-15 ENCOUNTER — Ambulatory Visit (INDEPENDENT_AMBULATORY_CARE_PROVIDER_SITE_OTHER): Admitting: Primary Care

## 2021-07-15 VITALS — BP 140/78 | HR 78 | Temp 97.6°F | Ht 63.0 in | Wt 177.0 lb

## 2021-07-15 DIAGNOSIS — E785 Hyperlipidemia, unspecified: Secondary | ICD-10-CM | POA: Diagnosis not present

## 2021-07-15 DIAGNOSIS — E119 Type 2 diabetes mellitus without complications: Secondary | ICD-10-CM

## 2021-07-15 DIAGNOSIS — Z0000 Encounter for general adult medical examination without abnormal findings: Secondary | ICD-10-CM | POA: Diagnosis not present

## 2021-07-15 DIAGNOSIS — Z23 Encounter for immunization: Secondary | ICD-10-CM | POA: Diagnosis not present

## 2021-07-15 DIAGNOSIS — I10 Essential (primary) hypertension: Secondary | ICD-10-CM | POA: Diagnosis not present

## 2021-07-15 DIAGNOSIS — E2839 Other primary ovarian failure: Secondary | ICD-10-CM

## 2021-07-15 LAB — COMPREHENSIVE METABOLIC PANEL
ALT: 19 U/L (ref 0–35)
AST: 18 U/L (ref 0–37)
Albumin: 4.5 g/dL (ref 3.5–5.2)
Alkaline Phosphatase: 91 U/L (ref 39–117)
BUN: 17 mg/dL (ref 6–23)
CO2: 28 mEq/L (ref 19–32)
Calcium: 9.9 mg/dL (ref 8.4–10.5)
Chloride: 103 mEq/L (ref 96–112)
Creatinine, Ser: 0.65 mg/dL (ref 0.40–1.20)
GFR: 92.57 mL/min (ref >60.00)
Glucose, Bld: 132 mg/dL — ABNORMAL HIGH (ref 70–99)
Potassium: 4 mEq/L (ref 3.5–5.1)
Sodium: 139 mEq/L (ref 135–145)
Total Bilirubin: 0.7 mg/dL (ref 0.2–1.2)
Total Protein: 7.8 g/dL (ref 6.0–8.3)

## 2021-07-15 LAB — LIPID PANEL
Cholesterol: 183 mg/dL (ref 0–200)
HDL: 59.4 mg/dL (ref >39.00)
LDL Cholesterol: 97 mg/dL (ref 0–99)
NonHDL: 124.06
Total CHOL/HDL Ratio: 3
Triglycerides: 137 mg/dL (ref 0.0–149.0)
VLDL: 27.4 mg/dL (ref 0.0–40.0)

## 2021-07-15 LAB — HEMOGLOBIN A1C: Hgb A1c MFr Bld: 7.2 % — ABNORMAL HIGH (ref 4.6–6.5)

## 2021-07-15 NOTE — Patient Instructions (Addendum)
Stop by the lab prior to leaving today. I will notify you of your results once received.   Please schedule a follow up visit for 6 months for diabetes check.  It was a pleasure to see you today!  Preventive Care 65 Years and Older, Female Preventive care refers to lifestyle choices and visits with your health care provider that can promote health and wellness. This includes: A yearly physical exam. This is also called an annual wellness visit. Regular dental and eye exams. Immunizations. Screening for certain conditions. Healthy lifestyle choices, such as: Eating a healthy diet. Getting regular exercise. Not using drugs or products that contain nicotine and tobacco. Limiting alcohol use. What can I expect for my preventive care visit? Physical exam Your health care provider will check your: Height and weight. These may be used to calculate your BMI (body mass index). BMI is a measurement that tells if you are at a healthy weight. Heart rate and blood pressure. Body temperature. Skin for abnormal spots. Counseling Your health care provider may ask you questions about your: Past medical problems. Family's medical history. Alcohol, tobacco, and drug use. Emotional well-being. Home life and relationship well-being. Sexual activity. Diet, exercise, and sleep habits. History of falls. Memory and ability to understand (cognition). Work and work environment. Pregnancy and menstrual history. Access to firearms. What immunizations do I need?  Vaccines are usually given at various ages, according to a schedule. Your health care provider will recommend vaccines for you based on your age, medicalhistory, and lifestyle or other factors, such as travel or where you work. What tests do I need? Blood tests Lipid and cholesterol levels. These may be checked every 5 years, or more often depending on your overall health. Hepatitis C test. Hepatitis B test. Screening Lung cancer screening.  You may have this screening every year starting at age 55 if you have a 30-pack-year history of smoking and currently smoke or have quit within the past 15 years. Colorectal cancer screening. All adults should have this screening starting at age 50 and continuing until age 75. Your health care provider may recommend screening at age 45 if you are at increased risk. You will have tests every 1-10 years, depending on your results and the type of screening test. Diabetes screening. This is done by checking your blood sugar (glucose) after you have not eaten for a while (fasting). You may have this done every 1-3 years. Mammogram. This may be done every 1-2 years. Talk with your health care provider about how often you should have regular mammograms. Abdominal aortic aneurysm (AAA) screening. You may need this if you are a current or former smoker. BRCA-related cancer screening. This may be done if you have a family history of breast, ovarian, tubal, or peritoneal cancers. Other tests STD (sexually transmitted disease) testing, if you are at risk. Bone density scan. This is done to screen for osteoporosis. You may have this done starting at age 65. Talk with your health care provider about your test results, treatment options,and if necessary, the need for more tests. Follow these instructions at home: Eating and drinking  Eat a diet that includes fresh fruits and vegetables, whole grains, lean protein, and low-fat dairy products. Limit your intake of foods with high amounts of sugar, saturated fats, and salt. Take vitamin and mineral supplements as recommended by your health care provider. Do not drink alcohol if your health care provider tells you not to drink. If you drink alcohol: Limit how much you   have to 0-1 drink a day. Be aware of how much alcohol is in your drink. In the [place] one drink equals one 12 oz bottle of beer (355 mL), one 5 oz glass of wine (148 mL), or one 1 oz glass of  hard liquor (44 mL).  Lifestyle Take daily care of your teeth and gums. Brush your teeth every morning and night with fluoride toothpaste. Floss one time each day. Stay active. Exercise for at least 30 minutes 5 or more days each week. Do not use any products that contain nicotine or tobacco, such as cigarettes, e-cigarettes, and chewing tobacco. If you need help quitting, ask your health care provider. Do not use drugs. If you are sexually active, practice safe sex. Use a condom or other form of protection in order to prevent STIs (sexually transmitted infections). Talk with your health care provider about taking a low-dose aspirin or statin. Find healthy ways to cope with stress, such as: Meditation, yoga, or listening to music. Journaling. Talking to a trusted person. Spending time with friends and family. Safety Always wear your seat belt while driving or riding in a vehicle. Do not drive: If you have been drinking alcohol. Do not ride with someone who has been drinking. When you are tired or distracted. While texting. Wear a helmet and other protective equipment during sports activities. If you have firearms in your house, make sure you follow all gun safety procedures. What's next? Visit your health care provider once a year for an annual wellness visit. Ask your health care provider how often you should have your eyes and teeth checked. Stay up to date on all vaccines. This information is not intended to replace advice given to you by your health care provider. Make sure you discuss any questions you have with your healthcare provider. Document Revised: 11/18/2020 Document Reviewed: 11/22/2018 Elsevier Patient Education  2022 Elsevier Inc.   

## 2021-07-15 NOTE — Addendum Note (Signed)
Addended by: Francella Solian on: 07/15/2021 12:41 PM   Modules accepted: Orders

## 2021-07-15 NOTE — Progress Notes (Signed)
Subjective:    Patient ID: Melissa Baldwin, female    DOB: 09/13/1956, 65 y.o.   MRN: [id number]  HPI  Melissa Baldwin is a very pleasant 65 y.o. female who presents today for complete physical and follow up of chronic conditions.  Immunizations: -Tetanus: 2013 -Influenza: Due this season -Covid-19: 4 vaccines -Shingles: Shingrix series in 2020 -Pneumonia: Pneumovax in 2019, never completed Prevnar  Diet: Fair diet.  Exercise: No regular exercise.  Eye exam: Completes annually  Dental exam: Completes semi-annually   Mammogram: Completed in July 2022 Dexa: Due, never completed  Colonoscopy: Completed in 2018, due in 2023   BP Readings from Last 3 Encounters:  07/15/21 140/78  01/13/21 130/82  06/17/20 124/82   She is checking BP at home at times, mostly running in the low 130's/80's.    Review of Systems  Constitutional:  Negative for unexpected weight change.  HENT:  Negative for rhinorrhea.   Respiratory:  Negative for cough and shortness of breath.   Cardiovascular:  Negative for chest pain.  Gastrointestinal:  Positive for diarrhea. Negative for constipation.       Diarrhea with metformin use  Genitourinary:  Negative for difficulty urinating.  Musculoskeletal:  Negative for arthralgias and myalgias.  Skin:  Negative for rash.  Allergic/Immunologic: Negative for environmental allergies.  Neurological:  Negative for dizziness and headaches.  Psychiatric/Behavioral:  The patient is not nervous/anxious.         Past Medical History:  Diagnosis Date   Diabetes mellitus without complication ([place])    Hyperlipidemia    Seasonal allergies     Social History   Socioeconomic History   Marital status: Married    Spouse name: Not on file   Number of children: Not on file   Years of education: Not on file   Highest education level: Not on file  Occupational History   Not on file  Tobacco Use   Smoking status: Never   Smokeless tobacco: Never   Substance and Sexual Activity   Alcohol use: Yes   Drug use: No   Sexual activity: Not on file  Other Topics Concern   Not on file  Social History Narrative   Married.   1 child.   M[facility] from [facility]   Works as a Art therapist.   Social Determinants of Health   Financial Resource Strain: Not on file  Food Insecurity: Not on file  Transportation Needs: Not on file  Physical Activity: Not on file  Stress: Not on file  Social Connections: Not on file  Intimate Partner Violence: Not on file    Past Surgical History:  Procedure Laterality Date   BREAST BIOPSY Right    neg   BREAST CYST ASPIRATION Left    BREAST EXCISIONAL BIOPSY     BREAST LUMPECTOMY Right    fibrocystic ? early 90's   COLONOSCOPY WITH PROPOFOL N/A 04/27/2017   Procedure: COLONOSCOPY WITH PROPOFOL;  Surgeon: Jonathon Bellows, MD;  Location: [facility] ENDOSCOPY;  Service: Endoscopy;  Laterality: N/A;   TONSILLECTOMY     UTERINE FIBROID SURGERY      Family History  Problem Relation Age of Onset   Alzheimer's disease Mother    Diabetes Paternal Grandfather    Breast cancer Neg Hx     Allergies  Allergen Reactions   Penicillins     Current Outpatient Medications on File Prior to Visit  Medication Sig Dispense Refill   atorvastatin (LIPITOR) 40 MG tablet TAKE 1 TABLET(40 MG) BY MOUTH EVERY EVENING  FOR CHOLESTEROL 90 tablet 0   lisinopril (ZESTRIL) 10 MG tablet TAKE 1 TABLET BY MOUTH EVERY DAY FOR BLOOD PRESSURE 90 tablet 0   loratadine (CLARITIN) 10 MG tablet Take 10 mg by mouth daily.     metFORMIN (GLUCOPHAGE) 500 MG tablet TAKE 1 TABLET(500 MG) BY MOUTH TWICE DAILY WITH A MEAL 180 tablet 0   No current facility-administered medications on file prior to visit.    BP 140/78   Pulse 78   Temp 97.6 F (36.4 C) (Temporal)   Ht '5\' 3"'$  (1.6 m)   Wt 177 lb (80.3 kg)   SpO2 98%   BMI 31.35 kg/m  Objective:   Physical Exam HENT:     Right Ear: Tympanic membrane and ear canal normal.     Left Ear:  Tympanic membrane and ear canal normal.     Nose: Nose normal.  Eyes:     Conjunctiva/sclera: Conjunctivae normal.     Pupils: Pupils are equal, round, and reactive to light.  Neck:     Thyroid: No thyromegaly.  Cardiovascular:     Rate and Rhythm: Normal rate and regular rhythm.     Heart sounds: No murmur heard. Pulmonary:     Effort: Pulmonary effort is normal.     Breath sounds: Normal breath sounds. No rales.  Abdominal:     General: Bowel sounds are normal.     Palpations: Abdomen is soft.     Tenderness: There is no abdominal tenderness.  Musculoskeletal:        General: Normal range of motion.     Cervical back: Neck supple.  Lymphadenopathy:     Cervical: No cervical adenopathy.  Skin:    General: Skin is warm and dry.     Findings: No rash.  Neurological:     Mental Status: She is alert and oriented to person, place, and time.     Cranial Nerves: No cranial nerve deficit.     Deep Tendon Reflexes: Reflexes are normal and symmetric.  Psychiatric:        Mood and Affect: Mood normal.          Assessment & Plan:      This visit occurred during the SARS-CoV-2 public health emergency.  Safety protocols were in place, including screening questions prior to the visit, additional usage of staff PPE, and extensive cleaning of exam room while observing appropriate contact time as indicated for disinfecting solutions.

## 2021-07-15 NOTE — Assessment & Plan Note (Signed)
Above goal in the office, home readings are improved.  Continue lisinopril 10 mg daily.   CMP pending.

## 2021-07-15 NOTE — Assessment & Plan Note (Signed)
Compliant to atorvastatin 40 mg daily, continue same. Repeat lipid panel pending.

## 2021-07-15 NOTE — Assessment & Plan Note (Signed)
Compliant to Metformin 500 mg BID. This is causing GI upset and diarrhea. Will switch to Metformin XR 500 mg, she will notify us when ready to switch.   Managed on statin and ACE-I. Eye exam UTD. Foot exam UTD.  Follow up in 6 months.

## 2021-07-15 NOTE — Assessment & Plan Note (Signed)
Due for Prevnar 20, provided today. Other vaccines UTD.  Bone density scan due, ordered. Mammogram UTD. Colonoscopy UTD, due in 2023.  Discussed the importance of a healthy diet and regular exercise in order for weight loss, and to reduce the risk of further co-morbidity.  Exam today stable. Labs pending.

## 2021-08-28 DIAGNOSIS — E119 Type 2 diabetes mellitus without complications: Secondary | ICD-10-CM

## 2021-08-28 DIAGNOSIS — E785 Hyperlipidemia, unspecified: Secondary | ICD-10-CM

## 2021-08-28 DIAGNOSIS — I10 Essential (primary) hypertension: Secondary | ICD-10-CM

## 2021-08-31 MED ORDER — METFORMIN HCL 500 MG PO TABS: ORAL_TABLET | ORAL | 1 refills | Status: DC

## 2021-08-31 MED ORDER — LISINOPRIL 10 MG PO TABS: ORAL_TABLET | ORAL | 3 refills | Status: DC

## 2021-08-31 MED ORDER — ATORVASTATIN CALCIUM 40 MG PO TABS: ORAL_TABLET | ORAL | 3 refills | Status: DC

## 2021-09-13 ENCOUNTER — Encounter: Payer: Self-pay | Admitting: Primary Care

## 2021-09-30 ENCOUNTER — Other Ambulatory Visit: Payer: Self-pay

## 2021-09-30 ENCOUNTER — Ambulatory Visit
Admission: RE | Admit: 2021-09-30 | Discharge: 2021-09-30 | Disposition: A | Discharge status: Home / Self Care | Source: Ambulatory Visit | Attending: Primary Care | Admitting: Primary Care

## 2021-09-30 DIAGNOSIS — E2839 Other primary ovarian failure: Secondary | ICD-10-CM | POA: Insufficient documentation

## 2022-01-12 ENCOUNTER — Other Ambulatory Visit: Payer: Self-pay | Admitting: Primary Care

## 2022-01-12 DIAGNOSIS — E119 Type 2 diabetes mellitus without complications: Secondary | ICD-10-CM

## 2022-01-18 ENCOUNTER — Ambulatory Visit (INDEPENDENT_AMBULATORY_CARE_PROVIDER_SITE_OTHER): Admitting: Primary Care

## 2022-01-18 ENCOUNTER — Ambulatory Visit (INDEPENDENT_AMBULATORY_CARE_PROVIDER_SITE_OTHER)
Admission: RE | Admit: 2022-01-18 | Discharge: 2022-01-18 | Disposition: A | Discharge status: Home / Self Care | Source: Ambulatory Visit | Attending: Primary Care | Admitting: Primary Care

## 2022-01-18 ENCOUNTER — Encounter: Payer: Self-pay | Admitting: Primary Care

## 2022-01-18 ENCOUNTER — Other Ambulatory Visit: Payer: Self-pay

## 2022-01-18 VITALS — BP 120/86 | HR 94 | Temp 97.7°F | Resp 18 | Ht 62.5 in | Wt 182.4 lb

## 2022-01-18 DIAGNOSIS — R2242 Localized swelling, mass and lump, left lower limb: Secondary | ICD-10-CM

## 2022-01-18 DIAGNOSIS — E1165 Type 2 diabetes mellitus with hyperglycemia: Secondary | ICD-10-CM

## 2022-01-18 LAB — POCT GLYCOSYLATED HEMOGLOBIN (HGB A1C): Hemoglobin A1C: 6.9 % — AB (ref 4.0–5.6)

## 2022-01-18 NOTE — Assessment & Plan Note (Signed)
Chronic, appears bony.  No obvious infection noted today.  Doesn't appear to be gout.  Given trauma, will obtain xrays. Consider referral to podiatry or orthopedics, await xray results.

## 2022-01-18 NOTE — Progress Notes (Signed)
Subjective:    Patient ID: Melissa Baldwin, female    DOB: 14-Feb-1956, 66 y.o.   MRN: [id number]  HPI  Melissa Baldwin is a very pleasant 66 y.o. female with a history of type 2 diabetes, hypertension, hyperlipidemia who presents today for follow up of diabetes.  1) Foot Mass/Pain: She would also like to mention acute on chronic foot mass/pain. Chronic "bump" to left first metatarsal joint for 10+ years after catching her left great toe into the crack of a sidewalk causing a fall. Since then she's noticed intermittent pain, overall not bothersome until she stubbed her foot as she got caught up in a sheet while doing laundry.   She'd like to see someone regarding the chronic foot mass. Her recent pain is improving. She's noticed increased swelling and redness to the site that is also improving.   2) Type 2 Diabetes:   Current medications include: Metformin 500 mg BID  She is checking her blood glucose 0 times daily.  Last A1C: 7.2 in August 2022, 6.9 today Last Eye Exam: UTD Last Foot Exam: Due Pneumonia Vaccination: 2022 Urine Microalbumin: None. Managed on ACE-I Statin: atorvastatin   Dietary changes since last visit: None.   Exercise: No regular exercise for a few weeks due to toe strain.   BP Readings from Last 3 Encounters:  01/18/22 120/86  07/15/21 140/78  01/13/21 130/82       Review of Systems  Eyes:  Negative for visual disturbance.  Respiratory:  Negative for shortness of breath.   Cardiovascular:  Negative for chest pain.  Musculoskeletal:  Positive for arthralgias and joint swelling.  Skin:  Positive for color change.  Neurological:  Negative for dizziness.        Past Medical History:  Diagnosis Date   Diabetes mellitus without complication ([place])    Hyperlipidemia    Seasonal allergies     Social History   Socioeconomic History   Marital status: Married    Spouse name: Not on file   Number of children: Not on file   Years of  education: Not on file   Highest education level: Not on file  Occupational History   Not on file  Tobacco Use   Smoking status: Never   Smokeless tobacco: Never  Substance and Sexual Activity   Alcohol use: Yes   Drug use: No   Sexual activity: Not on file  Other Topics Concern   Not on file  Social History Narrative   Married.   1 child.   Moved from [facility]   Works as a Art therapist.   Social Determinants of Health   Financial Resource Strain: Not on file  Food Insecurity: Not on file  Transportation Needs: Not on file  Physical Activity: Not on file  Stress: Not on file  Social Connections: Not on file  Intimate Partner Violence: Not on file    Past Surgical History:  Procedure Laterality Date   BREAST BIOPSY Right    neg   BREAST CYST ASPIRATION Left    BREAST EXCISIONAL BIOPSY     BREAST LUMPECTOMY Right    fibrocystic ? early 90's   COLONOSCOPY WITH PROPOFOL N/A 04/27/2017   Procedure: COLONOSCOPY WITH PROPOFOL;  Surgeon: Jonathon Bellows, MD;  Location: [facility] ENDOSCOPY;  Service: Endoscopy;  Laterality: N/A;   TONSILLECTOMY     UTERINE FIBROID SURGERY      Family History  Problem Relation Age of Onset   Alzheimer's disease Mother    Diabetes Paternal Grandfather  Breast cancer Neg Hx     Allergies  Allergen Reactions   Penicillins     Current Outpatient Medications on File Prior to Visit  Medication Sig Dispense Refill   atorvastatin (LIPITOR) 40 MG tablet TAKE 1 TABLET(40 MG) BY MOUTH EVERY EVENING FOR CHOLESTEROL 90 tablet 3   lisinopril (ZESTRIL) 10 MG tablet TAKE 1 TABLET BY MOUTH EVERY DAY FOR BLOOD PRESSURE 90 tablet 3   loratadine (CLARITIN) 10 MG tablet Take 10 mg by mouth daily.     metFORMIN (GLUCOPHAGE) 500 MG tablet TAKE 1 TABLET(500 MG) BY MOUTH TWICE DAILY WITH A MEAL for diabetes. 180 tablet 1   No current facility-administered medications on file prior to visit.    BP 120/86 (BP Location: Left Arm, Patient Position: Sitting,  Cuff Size: Large)    Pulse 94    Temp 97.7 F (36.5 C) (Oral)    Resp 18    Ht 5\' 3"  (1.6 m)    Wt 182 lb 6.4 oz (82.7 kg)    SpO2 98%    BMI 32.31 kg/m  Objective:   Physical Exam Cardiovascular:     Rate and Rhythm: Normal rate and regular rhythm.  Pulmonary:     Effort: Pulmonary effort is normal.     Breath sounds: Normal breath sounds.  Musculoskeletal:     Cervical back: Neck supple.       Feet:  Feet:     Comments: Mild erythema and swelling to left first metatarsal joint. Non tender. Slight decrease in ROM.  Skin:    General: Skin is warm and dry.     Findings: Erythema present.          Assessment & Plan:      This visit occurred during the SARS-CoV-2 public health emergency.  Safety protocols were in place, including screening questions prior to the visit, additional usage of staff PPE, and extensive cleaning of exam room while observing appropriate contact time as indicated for disinfecting solutions.

## 2022-01-18 NOTE — Assessment & Plan Note (Signed)
Controlled with A1C today of 6.9.  Continue metformin 500 mg BID. Managed on ACE-I and statin. Pneumonia vaccine UTD.  Foot exam today. Eye exam UTD.  Follow up in 6 months for CPE.

## 2022-01-18 NOTE — Patient Instructions (Signed)
Complete xray(s) prior to leaving today. I will notify you of your results once received.  It was a pleasure to see you today!  

## 2022-01-19 DIAGNOSIS — R2242 Localized swelling, mass and lump, left lower limb: Secondary | ICD-10-CM

## 2022-01-19 DIAGNOSIS — M25572 Pain in left ankle and joints of left foot: Secondary | ICD-10-CM

## 2022-04-14 ENCOUNTER — Other Ambulatory Visit: Payer: Self-pay | Admitting: Primary Care

## 2022-04-14 DIAGNOSIS — E119 Type 2 diabetes mellitus without complications: Secondary | ICD-10-CM

## 2022-05-04 LAB — HM DIABETES EYE EXAM

## 2022-05-05 ENCOUNTER — Other Ambulatory Visit: Payer: Self-pay | Admitting: Primary Care

## 2022-05-05 DIAGNOSIS — Z1231 Encounter for screening mammogram for malignant neoplasm of breast: Secondary | ICD-10-CM

## 2022-06-20 ENCOUNTER — Ambulatory Visit
Admission: RE | Admit: 2022-06-20 | Discharge: 2022-06-20 | Disposition: A | Discharge status: Home / Self Care | Payer: Medicare Other | Source: Ambulatory Visit | Attending: Primary Care | Admitting: Primary Care

## 2022-06-20 DIAGNOSIS — Z1231 Encounter for screening mammogram for malignant neoplasm of breast: Secondary | ICD-10-CM | POA: Diagnosis present

## 2022-07-19 ENCOUNTER — Encounter: Payer: Self-pay | Admitting: Primary Care

## 2022-07-19 ENCOUNTER — Ambulatory Visit: Payer: Medicare Other | Admitting: Primary Care

## 2022-07-19 VITALS — BP 126/75 | HR 90 | Temp 98.6°F | Ht 62.5 in | Wt 177.0 lb

## 2022-07-19 DIAGNOSIS — Z1211 Encounter for screening for malignant neoplasm of colon: Secondary | ICD-10-CM | POA: Diagnosis not present

## 2022-07-19 DIAGNOSIS — E1165 Type 2 diabetes mellitus with hyperglycemia: Secondary | ICD-10-CM

## 2022-07-19 DIAGNOSIS — I10 Essential (primary) hypertension: Secondary | ICD-10-CM

## 2022-07-19 DIAGNOSIS — R2242 Localized swelling, mass and lump, left lower limb: Secondary | ICD-10-CM | POA: Diagnosis not present

## 2022-07-19 DIAGNOSIS — E785 Hyperlipidemia, unspecified: Secondary | ICD-10-CM

## 2022-07-19 DIAGNOSIS — Z0000 Encounter for general adult medical examination without abnormal findings: Secondary | ICD-10-CM

## 2022-07-19 LAB — LIPID PANEL
Cholesterol: 198 mg/dL (ref 0–200)
HDL: 58.8 mg/dL (ref >39.00)
LDL Cholesterol: 113 mg/dL — ABNORMAL HIGH (ref 0–99)
NonHDL: 138.86
Total CHOL/HDL Ratio: 3
Triglycerides: 128 mg/dL (ref 0.0–149.0)
VLDL: 25.6 mg/dL (ref 0.0–40.0)

## 2022-07-19 LAB — HEMOGLOBIN A1C: Hgb A1c MFr Bld: 7.5 % — ABNORMAL HIGH (ref 4.6–6.5)

## 2022-07-19 LAB — CBC
HCT: 39.6 % (ref 36.0–46.0)
Hemoglobin: 13.2 g/dL (ref 12.0–15.0)
MCHC: 33.3 g/dL (ref 30.0–36.0)
MCV: 87.7 fl (ref 78.0–100.0)
Platelets: 392 10*3/uL (ref 150.0–400.0)
RBC: 4.51 Mil/uL (ref 3.87–5.11)
RDW: 14 % (ref 11.5–15.5)
WBC: 8.9 10*3/uL (ref 4.0–10.5)

## 2022-07-19 LAB — COMPREHENSIVE METABOLIC PANEL
ALT: 23 U/L (ref 0–35)
AST: 17 U/L (ref 0–37)
Albumin: 4.4 g/dL (ref 3.5–5.2)
Alkaline Phosphatase: 96 U/L (ref 39–117)
BUN: 15 mg/dL (ref 6–23)
CO2: 28 mEq/L (ref 19–32)
Calcium: 9.6 mg/dL (ref 8.4–10.5)
Chloride: 104 mEq/L (ref 96–112)
Creatinine, Ser: 0.64 mg/dL (ref 0.40–1.20)
GFR: 92.26 mL/min (ref >60.00)
Glucose, Bld: 143 mg/dL — ABNORMAL HIGH (ref 70–99)
Potassium: 4.1 mEq/L (ref 3.5–5.1)
Sodium: 141 mEq/L (ref 135–145)
Total Bilirubin: 0.7 mg/dL (ref 0.2–1.2)
Total Protein: 7.6 g/dL (ref 6.0–8.3)

## 2022-07-19 NOTE — Assessment & Plan Note (Signed)
Repeat A1C pending.  Continue metformin 500 mg BID.  Managed on statin and ACE-I. Pneumonia vaccine UTD.  Foot and eye exams UTD.  Follow up in 3-6 months based on A1C results

## 2022-07-19 NOTE — Progress Notes (Signed)
Subjective:    Patient ID: Melissa Baldwin, female    DOB: 03/13/1956, 66 y.o.   MRN: [id number]  HPI  Melissa Baldwin is a very pleasant 66 y.o. female who presents today for complete physical and follow up of chronic conditions.  Immunizations: -Tetanus: 2013 -Influenza: Due this season  -Covid-19: 4 vaccines  -Shingles: Completed Shingrix -Pneumonia: Prevnar 20 in 2022, pneumovax in 2019  Diet: [place].  Exercise: No regular exercise.  Eye exam: Completes annually  Dental exam: Completes semi-annually   Mammogram: Completed in July 2023  Colonoscopy: Completed in 2018, due 2023  Dexa: Completed in 2022  BP Readings from Last 3 Encounters:  07/19/22 126/75  01/18/22 120/86  07/15/21 140/78        Review of Systems  Constitutional:  Negative for unexpected weight change.  HENT:  Negative for rhinorrhea.   Eyes:  Negative for visual disturbance.  Respiratory:  Negative for cough and shortness of breath.   Cardiovascular:  Negative for chest pain.  Gastrointestinal:  Negative for constipation and diarrhea.  Genitourinary:  Negative for difficulty urinating.  Musculoskeletal:  Positive for arthralgias.  Skin:  Negative for rash.  Allergic/Immunologic: Negative for environmental allergies.  Neurological:  Negative for dizziness and headaches.  Psychiatric/Behavioral:  The patient is not hyperactive.          Past Medical History:  Diagnosis Date   Diabetes mellitus without complication ([place])    Hyperlipidemia    Seasonal allergies     Social History   Socioeconomic History   Marital status: Married    Spouse name: Not on file   Number of children: Not on file   Years of education: Not on file   Highest education level: Not on file  Occupational History   Not on file  Tobacco Use   Smoking status: Never   Smokeless tobacco: Never  Substance and Sexual Activity   Alcohol use: Yes   Drug use: No   Sexual activity: Not on file  Other  Topics Concern   Not on file  Social History Narrative   Married.   1 child.   Moved from [facility]   Works as a Art therapist.   Social Determinants of Health   Financial Resource Strain: Not on file  Food Insecurity: Not on file  Transportation Needs: Not on file  Physical Activity: Not on file  Stress: Not on file  Social Connections: Not on file  Intimate Partner Violence: Not on file    Past Surgical History:  Procedure Laterality Date   BREAST BIOPSY Right    neg   BREAST CYST ASPIRATION Left    BREAST EXCISIONAL BIOPSY     BREAST LUMPECTOMY Right    fibrocystic ? early 90's   COLONOSCOPY WITH PROPOFOL N/A 04/27/2017   Procedure: COLONOSCOPY WITH PROPOFOL;  Surgeon: Jonathon Bellows, MD;  Location: [facility] ENDOSCOPY;  Service: Endoscopy;  Laterality: N/A;   TONSILLECTOMY     UTERINE FIBROID SURGERY      Family History  Problem Relation Age of Onset   Alzheimer's disease Mother    Diabetes Paternal Grandfather    Breast cancer Neg Hx     Allergies  Allergen Reactions   Penicillins     Current Outpatient Medications on File Prior to Visit  Medication Sig Dispense Refill   atorvastatin (LIPITOR) 40 MG tablet TAKE 1 TABLET(40 MG) BY MOUTH EVERY EVENING FOR CHOLESTEROL 90 tablet 3   lisinopril (ZESTRIL) 10 MG tablet TAKE 1 TABLET BY MOUTH  EVERY DAY FOR BLOOD PRESSURE 90 tablet 3   loratadine (CLARITIN) 10 MG tablet Take 10 mg by mouth daily.     meloxicam (MOBIC) 7.5 MG tablet Take 7.5 mg by mouth daily.     metFORMIN (GLUCOPHAGE) 500 MG tablet TAKE 1 TABLET BY MOUTH TWICE DAILY WITH A MEAL FOR DIABETES. 180 tablet 0   No current facility-administered medications on file prior to visit.    BP 126/75   Pulse 90   Temp 98.6 F (37 C) (Oral)   Ht 5' 2.5" (1.588 m)   Wt 177 lb (80.3 kg)   SpO2 96%   BMI 31.86 kg/m  Objective:   Physical Exam HENT:     Right Ear: Tympanic membrane and ear canal normal.     Left Ear: Tympanic membrane and ear canal normal.      Nose: Nose normal.  Eyes:     Conjunctiva/sclera: Conjunctivae normal.     Pupils: Pupils are equal, round, and reactive to light.  Neck:     Thyroid: No thyromegaly.  Cardiovascular:     Rate and Rhythm: Normal rate and regular rhythm.     Heart sounds: No murmur heard. Pulmonary:     Effort: Pulmonary effort is normal.     Breath sounds: Normal breath sounds. No rales.  Abdominal:     General: Bowel sounds are normal.     Palpations: Abdomen is soft.     Tenderness: There is no abdominal tenderness.  Musculoskeletal:        General: Normal range of motion.     Cervical back: Neck supple.  Lymphadenopathy:     Cervical: No cervical adenopathy.  Skin:    General: Skin is warm and dry.     Findings: No rash.  Neurological:     Mental Status: She is alert and oriented to person, place, and time.     Cranial Nerves: No cranial nerve deficit.     Deep Tendon Reflexes: Reflexes are normal and symmetric.  Psychiatric:        Mood and Affect: Mood normal.           Assessment & Plan:   Problem List Items Addressed This Visit       Cardiovascular and Mediastinum   Essential hypertension    Controlled.  Continue lisinopril 10 mg daily. CMP pending      Relevant Orders   Comprehensive metabolic panel   CBC     Endocrine   Type 2 diabetes mellitus with hyperglycemia (HCC)    Repeat A1C pending.  Continue metformin 500 mg BID.  Managed on statin and ACE-I. Pneumonia vaccine UTD.  Foot and eye exams UTD.  Follow up in 3-6 months based on A1C results       Relevant Orders   Hemoglobin A1c     Other   Hyperlipidemia    Repeat lipid panel pending.  Continue atorvastatin 40 mg daily.      Relevant Orders   Lipid panel   Preventative health care - Primary    Immunizations UTD. Mammogram UTD. Colonoscopy due, referral placed to GI. Bone density scan UTD  Discussed the importance of a healthy diet and regular exercise in order for weight loss, and to  reduce the risk of further co-morbidity.  Exam stable. Labs pending.  Follow up in 1 year for repeat physical.       Foot mass, left    Stable.  Continue Meloxicam 7.5 mg PRN.  Other Visit Diagnoses     Screening for colon cancer       Relevant Orders   Ambulatory referral to Gastroenterology          Pleas Koch, NP

## 2022-07-19 NOTE — Assessment & Plan Note (Signed)
Controlled.   Continue lisinopril 10 mg daily.  CMP pending.  

## 2022-07-19 NOTE — Assessment & Plan Note (Signed)
Repeat lipid panel pending. Continue atorvastatin 40 mg daily. 

## 2022-07-19 NOTE — Assessment & Plan Note (Signed)
Stable.  Continue Meloxicam 7.5 mg PRN.

## 2022-07-19 NOTE — Patient Instructions (Signed)
Stop by the lab prior to leaving today. I will notify you of your results once received.   You will be contacted regarding your referral to GI for the colonoscopy.  Please let us know if you have not been contacted within two weeks.   It was a pleasure to see you today!  Preventive Care 65 Years and Older, Female Preventive care refers to lifestyle choices and visits with your health care provider that can promote health and wellness. Preventive care visits are also called wellness exams. What can I expect for my preventive care visit? Counseling Your health care provider may ask you questions about your: Medical history, including: Past medical problems. Family medical history. Pregnancy and menstrual history. History of falls. Current health, including: Memory and ability to understand (cognition). Emotional well-being. Home life and relationship well-being. Sexual activity and sexual health. Lifestyle, including: Alcohol, nicotine or tobacco, and drug use. Access to firearms. Diet, exercise, and sleep habits. Work and work environment. Sunscreen use. Safety issues such as seatbelt and bike helmet use. Physical exam Your health care provider will check your: Height and weight. These may be used to calculate your BMI (body mass index). BMI is a measurement that tells if you are at a healthy weight. Waist circumference. This measures the distance around your waistline. This measurement also tells if you are at a healthy weight and may help predict your risk of certain diseases, such as type 2 diabetes and high blood pressure. Heart rate and blood pressure. Body temperature. Skin for abnormal spots. What immunizations do I need?  Vaccines are usually given at various ages, according to a schedule. Your health care provider will recommend vaccines for you based on your age, medical history, and lifestyle or other factors, such as travel or where you work. What tests do I  need? Screening Your health care provider may recommend screening tests for certain conditions. This may include: Lipid and cholesterol levels. Hepatitis C test. Hepatitis B test. HIV (human immunodeficiency virus) test. STI (sexually transmitted infection) testing, if you are at risk. Lung cancer screening. Colorectal cancer screening. Diabetes screening. This is done by checking your blood sugar (glucose) after you have not eaten for a while (fasting). Mammogram. Talk with your health care provider about how often you should have regular mammograms. BRCA-related cancer screening. This may be done if you have a family history of breast, ovarian, tubal, or peritoneal cancers. Bone density scan. This is done to screen for osteoporosis. Talk with your health care provider about your test results, treatment options, and if necessary, the need for more tests. Follow these instructions at home: Eating and drinking  Eat a diet that includes fresh fruits and vegetables, whole grains, lean protein, and low-fat dairy products. Limit your intake of foods with high amounts of sugar, saturated fats, and salt. Take vitamin and mineral supplements as recommended by your health care provider. Do not drink alcohol if your health care provider tells you not to drink. If you drink alcohol: Limit how much you have to 0-1 drink a day. Know how much alcohol is in your drink. In the [place] one drink equals one 12 oz bottle of beer (355 mL), one 5 oz glass of wine (148 mL), or one 1 oz glass of hard liquor (44 mL). Lifestyle Brush your teeth every morning and night with fluoride toothpaste. Floss one time each day. Exercise for at least 30 minutes 5 or more days each week. Do not use any products that contain   nicotine or tobacco. These products include cigarettes, chewing tobacco, and vaping devices, such as e-cigarettes. If you need help quitting, ask your health care provider. Do not use drugs. If you are  sexually active, practice safe sex. Use a condom or other form of protection in order to prevent STIs. Take aspirin only as told by your health care provider. Make sure that you understand how much to take and what form to take. Work with your health care provider to find out whether it is safe and beneficial for you to take aspirin daily. Ask your health care provider if you need to take a cholesterol-lowering medicine (statin). Find healthy ways to manage stress, such as: Meditation, yoga, or listening to music. Journaling. Talking to a trusted person. Spending time with friends and family. Minimize exposure to UV radiation to reduce your risk of skin cancer. Safety Always wear your seat belt while driving or riding in a vehicle. Do not drive: If you have been drinking alcohol. Do not ride with someone who has been drinking. When you are tired or distracted. While texting. If you have been using any mind-altering substances or drugs. Wear a helmet and other protective equipment during sports activities. If you have firearms in your house, make sure you follow all gun safety procedures. What's next? Visit your health care provider once a year for an annual wellness visit. Ask your health care provider how often you should have your eyes and teeth checked. Stay up to date on all vaccines. This information is not intended to replace advice given to you by your health care provider. Make sure you discuss any questions you have with your health care provider. Document Revised: 05/26/2021 Document Reviewed: 05/26/2021 Elsevier Patient Education  2023 Elsevier Inc.  

## 2022-07-19 NOTE — Assessment & Plan Note (Signed)
Immunizations UTD. Mammogram UTD. Colonoscopy due, referral placed to GI. Bone density scan UTD  Discussed the importance of a healthy diet and regular exercise in order for weight loss, and to reduce the risk of further co-morbidity.  Exam stable. Labs pending.  Follow up in 1 year for repeat physical.

## 2022-07-26 ENCOUNTER — Encounter: Payer: Self-pay | Admitting: Primary Care

## 2022-09-19 DIAGNOSIS — E785 Hyperlipidemia, unspecified: Secondary | ICD-10-CM

## 2022-09-19 DIAGNOSIS — E119 Type 2 diabetes mellitus without complications: Secondary | ICD-10-CM

## 2022-09-19 DIAGNOSIS — I10 Essential (primary) hypertension: Secondary | ICD-10-CM

## 2022-09-20 MED ORDER — METFORMIN HCL 500 MG PO TABS: ORAL_TABLET | ORAL | 0 refills | Status: DC

## 2022-09-20 MED ORDER — ATORVASTATIN CALCIUM 40 MG PO TABS: ORAL_TABLET | ORAL | 2 refills | Status: DC

## 2022-09-20 MED ORDER — LISINOPRIL 10 MG PO TABS: ORAL_TABLET | ORAL | 2 refills | Status: DC

## 2022-11-12 ENCOUNTER — Other Ambulatory Visit: Payer: Self-pay | Admitting: Primary Care

## 2022-11-12 DIAGNOSIS — E119 Type 2 diabetes mellitus without complications: Secondary | ICD-10-CM

## 2023-01-20 ENCOUNTER — Other Ambulatory Visit: Payer: Self-pay | Admitting: *Deleted

## 2023-01-20 ENCOUNTER — Ambulatory Visit (INDEPENDENT_AMBULATORY_CARE_PROVIDER_SITE_OTHER): Payer: Medicare Other | Admitting: Primary Care

## 2023-01-20 ENCOUNTER — Encounter: Payer: Self-pay | Admitting: Primary Care

## 2023-01-20 ENCOUNTER — Telehealth: Payer: Self-pay | Admitting: *Deleted

## 2023-01-20 VITALS — BP 148/82 | HR 80 | Temp 99.2°F | Ht 62.5 in | Wt 173.0 lb

## 2023-01-20 DIAGNOSIS — E1165 Type 2 diabetes mellitus with hyperglycemia: Secondary | ICD-10-CM

## 2023-01-20 DIAGNOSIS — I10 Essential (primary) hypertension: Secondary | ICD-10-CM

## 2023-01-20 DIAGNOSIS — Z86010 Personal history of colonic polyps: Secondary | ICD-10-CM

## 2023-01-20 LAB — POCT GLYCOSYLATED HEMOGLOBIN (HGB A1C): Hemoglobin A1C: 7 % — AB (ref 4.0–5.6)

## 2023-01-20 LAB — MICROALBUMIN / CREATININE URINE RATIO
Creatinine,U: 156 mg/dL
Microalb Creat Ratio: 0.8 mg/g (ref 0.0–30.0)
Microalb, Ur: 1.2 mg/dL (ref 0.0–1.9)

## 2023-01-20 MED ORDER — METFORMIN HCL ER 500 MG PO TB24: 500.0000 mg | ORAL_TABLET | Freq: Every day | ORAL | 1 refills | Status: DC

## 2023-01-20 MED ORDER — LISINOPRIL 10 MG PO TABS: ORAL_TABLET | ORAL | 1 refills | Status: DC

## 2023-01-20 NOTE — Progress Notes (Signed)
Subjective:    Patient ID: Melissa Baldwin, female    DOB: 1956-08-17, 67 y.o.   MRN: [id number]  HPI  Melissa Baldwin is a very pleasant 67 y.o. female with a history of hypertension, type 2 diabetes, hyperlipidemia who presents today for follow up of diabetes.  Current medications include: Metformin 500 mg BID. She does experience diarrhea and GI upset frequently.   She is checking her blood glucose 0 times daily.  Last A1C: 7.5 in August 2023, 7.0 today.  Last Eye Exam: UTD Last Foot Exam: Due Pneumonia Vaccination: 2022 Urine Microalbumin: Due Statin: atorvastatin   Dietary changes since last visit: None.    Exercise: Exercising several days weekly     Review of Systems  Respiratory:  Negative for shortness of breath.   Cardiovascular:  Negative for chest pain.  Gastrointestinal:  Positive for diarrhea.  Neurological:  Negative for numbness.         Past Medical History:  Diagnosis Date   Diabetes mellitus without complication ([place])    Hyperlipidemia    Seasonal allergies     Social History   Socioeconomic History   Marital status: Married    Spouse name: Not on file   Number of children: Not on file   Years of education: Not on file   Highest education level: Not on file  Occupational History   Not on file  Tobacco Use   Smoking status: Never   Smokeless tobacco: Never  Substance and Sexual Activity   Alcohol use: Yes   Drug use: No   Sexual activity: Not on file  Other Topics Concern   Not on file  Social History Narrative   Married.   1 child.   Moved from [facility]   Works as a Art therapist.   Social Determinants of Health   Financial Resource Strain: Not on file  Food Insecurity: Not on file  Transportation Needs: Not on file  Physical Activity: Not on file  Stress: Not on file  Social Connections: Not on file  Intimate Partner Violence: Not on file    Past Surgical History:  Procedure Laterality Date   BREAST BIOPSY  Right    neg   BREAST CYST ASPIRATION Left    BREAST EXCISIONAL BIOPSY     BREAST LUMPECTOMY Right    fibrocystic ? early 90's   COLONOSCOPY WITH PROPOFOL N/A 04/27/2017   Procedure: COLONOSCOPY WITH PROPOFOL;  Surgeon: Jonathon Bellows, MD;  Location: [facility] ENDOSCOPY;  Service: Endoscopy;  Laterality: N/A;   TONSILLECTOMY     UTERINE FIBROID SURGERY      Family History  Problem Relation Age of Onset   Alzheimer's disease Mother    Diabetes Paternal Grandfather    Breast cancer Neg Hx     Allergies  Allergen Reactions   Penicillins     Current Outpatient Medications on File Prior to Visit  Medication Sig Dispense Refill   atorvastatin (LIPITOR) 40 MG tablet TAKE 1 TABLET(40 MG) BY MOUTH EVERY EVENING FOR CHOLESTEROL 90 tablet 2   loratadine (CLARITIN) 10 MG tablet Take 10 mg by mouth daily.     meloxicam (MOBIC) 7.5 MG tablet Take 7.5 mg by mouth daily. (Patient not taking: Reported on 01/20/2023)     No current facility-administered medications on file prior to visit.    BP (!) 148/82   Pulse 80   Temp 99.2 F (37.3 C) (Temporal)   Ht 5' 2.5" (1.588 m)   Wt 173 lb (78.5 kg)  SpO2 97%   BMI 31.14 kg/m  Objective:   Physical Exam Cardiovascular:     Rate and Rhythm: Normal rate and regular rhythm.  Pulmonary:     Effort: Pulmonary effort is normal.     Breath sounds: Normal breath sounds.  Musculoskeletal:     Cervical back: Neck supple.  Skin:    General: Skin is warm and dry.           Assessment & Plan:  Type 2 diabetes mellitus with hyperglycemia, without long-term current use of insulin (HCC) Assessment & Plan: Improved with A1C today of 7.0!  Encouraged to continue to work on diet. Increase physical activity.  Foot exam today. Urine microalbumin due and pending.  Follow up in 6 months.  Orders: -     Microalbumin / creatinine urine ratio -     POCT glycosylated hemoglobin (Hb A1C) -     metFORMIN HCl ER; Take 1 tablet (500 mg total) by mouth  daily with breakfast. for diabetes.  Dispense: 90 tablet; Refill: 1  Essential hypertension -     Lisinopril; TAKE 1 TABLET BY MOUTH EVERY DAY FOR BLOOD PRESSURE  Dispense: 90 tablet; Refill: [place], NP

## 2023-01-20 NOTE — Patient Instructions (Addendum)
We changed your metformin to the XR version. Take 1 tablet by mouth every morning with breakfast.  Try using Flonase (fluticasone) nasal spray. Instill 1 spray in each nostril twice daily.   Stop by the lab prior to leaving today. I will notify you of your results once received.   Please schedule a physical to meet with me in 6 months.   It was a pleasure to see you today!

## 2023-01-20 NOTE — Telephone Encounter (Signed)
Gastroenterology Pre-Procedure Review  Request Date: 04/24/2023 Requesting Physician: Dr. Vicente Males  PATIENT REVIEW QUESTIONS: The patient responded to the following health history questions as indicated:    1. Are you having any GI issues? no 2. Do you have a personal history of Polyps? yes (04/27/2017) 3. Do you have a family history of Colon Cancer or Polyps? no 4. Diabetes Mellitus? no 5. Joint replacements in the past 12 months?no 6. Major health problems in the past 3 months?no 7. Any artificial heart valves, MVP, or defibrillator?no    MEDICATIONS & ALLERGIES:    Patient reports the following regarding taking any anticoagulation/antiplatelet therapy:   Plavix, Coumadin, Eliquis, Xarelto, Lovenox, Pradaxa, Brilinta, or Effient? no Aspirin? no  Patient confirms/reports the following medications:  Current Outpatient Medications  Medication Sig Dispense Refill   atorvastatin (LIPITOR) 40 MG tablet TAKE 1 TABLET(40 MG) BY MOUTH EVERY EVENING FOR CHOLESTEROL 90 tablet 2   lisinopril (ZESTRIL) 10 MG tablet TAKE 1 TABLET BY MOUTH EVERY DAY FOR BLOOD PRESSURE 90 tablet 1   loratadine (CLARITIN) 10 MG tablet Take 10 mg by mouth daily.     meloxicam (MOBIC) 7.5 MG tablet Take 7.5 mg by mouth daily. (Patient not taking: Reported on 01/20/2023)     metFORMIN (GLUCOPHAGE-XR) 500 MG 24 hr tablet Take 1 tablet (500 mg total) by mouth daily with breakfast. for diabetes. 90 tablet 1   No current facility-administered medications for this visit.    Patient confirms/reports the following allergies:  Allergies  Allergen Reactions   Penicillins     No orders of the defined types were placed in this encounter.   AUTHORIZATION INFORMATION Primary Insurance: 1D#: Group #:  Secondary Insurance: 1D#: Group #:  SCHEDULE INFORMATION: Date: 04/24/2023 Time: Location: [place]

## 2023-01-20 NOTE — Assessment & Plan Note (Signed)
Improved with A1C today of 7.0!  Encouraged to continue to work on diet. Increase physical activity.  Foot exam today. Urine microalbumin due and pending.  Follow up in 6 months.

## 2023-04-17 ENCOUNTER — Telehealth: Payer: Self-pay

## 2023-04-17 MED ORDER — NA SULFATE-K SULFATE-MG SULF 17.5-3.13-1.6 GM/177ML PO SOLN: 1.0000 | Freq: Once | ORAL | 0 refills | Status: AC

## 2023-04-17 NOTE — Telephone Encounter (Signed)
Pt left vm on discussing her prep for procedure on 04/24/2023 please return call

## 2023-04-17 NOTE — Addendum Note (Signed)
Addended by: Avie Arenas on: 04/17/2023 01:56 PM   Modules accepted: Orders

## 2023-04-17 NOTE — Telephone Encounter (Signed)
Patient call returned.  She inquired if she could start her prep the morning before her colonoscopy and do the second prep in the afternoon at 5pm.  She also wanted her prep sent to [facility].  Patient has been advised that she cannot do her colonoscopy prep this way because after a certain amount of time the small bowel starts to push bile back into the colon, causing the colon not to be empty which can lead to a poor prep leading to repeat colonoscopy.  Patient said she understands and will do her prep as instructed.  Her prep has been sent to [facility].  Thanks, [place], [place]

## 2023-04-18 ENCOUNTER — Other Ambulatory Visit: Payer: Self-pay | Admitting: Primary Care

## 2023-04-18 DIAGNOSIS — Z1231 Encounter for screening mammogram for malignant neoplasm of breast: Secondary | ICD-10-CM

## 2023-04-24 ENCOUNTER — Ambulatory Visit
Admission: RE | Admit: 2023-04-24 | Discharge: 2023-04-24 | Disposition: A | Discharge status: Home / Self Care | Payer: Medicare Other | Attending: Gastroenterology | Admitting: Gastroenterology

## 2023-04-24 ENCOUNTER — Encounter: Payer: Self-pay | Admitting: Gastroenterology

## 2023-04-24 ENCOUNTER — Encounter
Admission: RE | Disposition: A | Discharge status: Home / Self Care | Payer: Self-pay | Source: Home / Self Care | Attending: Gastroenterology

## 2023-04-24 ENCOUNTER — Ambulatory Visit: Payer: Medicare Other | Admitting: Anesthesiology

## 2023-04-24 DIAGNOSIS — Z7984 Long term (current) use of oral hypoglycemic drugs: Secondary | ICD-10-CM | POA: Insufficient documentation

## 2023-04-24 DIAGNOSIS — D122 Benign neoplasm of ascending colon: Secondary | ICD-10-CM | POA: Insufficient documentation

## 2023-04-24 DIAGNOSIS — Z6829 Body mass index (BMI) 29.0-29.9, adult: Secondary | ICD-10-CM | POA: Insufficient documentation

## 2023-04-24 DIAGNOSIS — I10 Essential (primary) hypertension: Secondary | ICD-10-CM | POA: Insufficient documentation

## 2023-04-24 DIAGNOSIS — Z1211 Encounter for screening for malignant neoplasm of colon: Secondary | ICD-10-CM | POA: Insufficient documentation

## 2023-04-24 DIAGNOSIS — D126 Benign neoplasm of colon, unspecified: Secondary | ICD-10-CM | POA: Diagnosis not present

## 2023-04-24 DIAGNOSIS — K5730 Diverticulosis of large intestine without perforation or abscess without bleeding: Secondary | ICD-10-CM | POA: Insufficient documentation

## 2023-04-24 DIAGNOSIS — D128 Benign neoplasm of rectum: Secondary | ICD-10-CM | POA: Diagnosis not present

## 2023-04-24 DIAGNOSIS — E119 Type 2 diabetes mellitus without complications: Secondary | ICD-10-CM | POA: Insufficient documentation

## 2023-04-24 DIAGNOSIS — Z860100 Personal history of colon polyps, unspecified: Secondary | ICD-10-CM

## 2023-04-24 DIAGNOSIS — E669 Obesity, unspecified: Secondary | ICD-10-CM | POA: Diagnosis not present

## 2023-04-24 HISTORY — PX: COLONOSCOPY WITH PROPOFOL: SHX5780

## 2023-04-24 HISTORY — DX: Essential (primary) hypertension: I10

## 2023-04-24 LAB — GLUCOSE, CAPILLARY: Glucose-Capillary: 141 mg/dL — ABNORMAL HIGH (ref 70–99)

## 2023-04-24 SURGERY — COLONOSCOPY WITH PROPOFOL
Anesthesia: General

## 2023-04-24 MED ORDER — PROPOFOL 500 MG/50ML IV EMUL
INTRAVENOUS | Status: DC | PRN
  Administered 2023-04-24: 50 mg via INTRAVENOUS
  Administered 2023-04-24: 150 ug/kg/min via INTRAVENOUS

## 2023-04-24 MED ORDER — PROPOFOL 1000 MG/100ML IV EMUL
INTRAVENOUS | Status: AC
  Filled 2023-04-24: qty 100

## 2023-04-24 MED ORDER — SODIUM CHLORIDE 0.9 % IV SOLN: INTRAVENOUS | Status: DC

## 2023-04-24 NOTE — Anesthesia Preprocedure Evaluation (Addendum)
Anesthesia Evaluation  Patient identified by MRN, date of birth, ID band Patient awake    Reviewed: Allergy & Precautions, NPO status , Patient's Chart, lab work & pertinent test results  History of Anesthesia Complications Negative for: history of anesthetic complications  Airway Mallampati: III   Neck ROM: Full    Dental   Crowns :   Pulmonary neg pulmonary ROS   Pulmonary exam normal breath sounds clear to auscultation       Cardiovascular hypertension, Normal cardiovascular exam Rhythm:Regular Rate:Normal     Neuro/Psych negative neurological ROS     GI/Hepatic negative GI ROS,,,  Endo/Other  diabetes, Type 2  Obesity   Renal/GU negative Renal ROS     Musculoskeletal   Abdominal   Peds  Hematology negative hematology ROS (+)   Anesthesia Other Findings   Reproductive/Obstetrics                             Anesthesia Physical Anesthesia Plan  ASA: 2  Anesthesia Plan: General   Post-op Pain Management:    Induction: Intravenous  PONV Risk Score and Plan: 3 and Propofol infusion, TIVA and Treatment may vary due to age or medical condition  Airway Management Planned: Natural Airway  Additional Equipment:   Intra-op Plan:   Post-operative Plan:   Informed Consent: I have reviewed the patients History and Physical, chart, labs and discussed the procedure including the risks, benefits and alternatives for the proposed anesthesia with the patient or authorized representative who has indicated his/her understanding and acceptance.       Plan Discussed with: CRNA  Anesthesia Plan Comments: (LMA/GETA backup discussed.  Patient consented for risks of anesthesia including but not limited to:  - adverse reactions to medications - damage to eyes, teeth, lips or other oral mucosa - nerve damage due to positioning  - sore throat or hoarseness - damage to heart, brain, nerves,  lungs, other parts of body or loss of life  Informed patient about role of CRNA in peri- and intra-operative care.  Patient voiced understanding.)       Anesthesia Quick Evaluation

## 2023-04-24 NOTE — H&P (Signed)
Wyline Mood, MD [address], [place], [place], [address] [address], [place], [place], [address] Phone: [phone]  Fax: [phone]  Primary Care Physician:  Doreene Nest, NP   Pre-Procedure History & Physical: HPI:  Melissa Baldwin is a 67 y.o. female is here for an colonoscopy.   Past Medical History:  Diagnosis Date   Diabetes mellitus without complication (HCC)    Hyperlipidemia    Hypertension    Seasonal allergies     Past Surgical History:  Procedure Laterality Date   BREAST BIOPSY Right    neg   BREAST CYST ASPIRATION Left    BREAST EXCISIONAL BIOPSY     BREAST LUMPECTOMY Right    fibrocystic ? early 90's   COLONOSCOPY WITH PROPOFOL N/A 04/27/2017   Procedure: COLONOSCOPY WITH PROPOFOL;  Surgeon: Wyline Mood, MD;  Location: [facility] ENDOSCOPY;  Service: Endoscopy;  Laterality: N/A;   TONSILLECTOMY     UTERINE FIBROID SURGERY      Prior to Admission medications   Medication Sig Start Date End Date Taking? Authorizing Provider  atorvastatin (LIPITOR) 40 MG tablet TAKE 1 TABLET(40 MG) BY MOUTH EVERY EVENING FOR CHOLESTEROL 09/20/22  Yes Doreene Nest, NP  lisinopril (ZESTRIL) 10 MG tablet TAKE 1 TABLET BY MOUTH EVERY DAY FOR BLOOD PRESSURE 01/20/23  Yes Doreene Nest, NP  loratadine (CLARITIN) 10 MG tablet Take 10 mg by mouth daily.   Yes [provider]  metFORMIN (GLUCOPHAGE-XR) 500 MG 24 hr tablet Take 1 tablet (500 mg total) by mouth daily with breakfast. for diabetes. 01/20/23  Yes Doreene Nest, NP  meloxicam (MOBIC) 7.5 MG tablet Take 7.5 mg by mouth daily. 03/08/22   [provider]    Allergies as of 01/20/2023 - Review Complete 01/20/2023  Allergen Reaction Noted   Penicillins  01/25/2017    Family History  Problem Relation Age of Onset   Alzheimer's disease Mother    Diabetes Paternal Grandfather    Breast cancer Neg Hx     Social History   Socioeconomic History   Marital  status: Married    Spouse name: Not on file   Number of children: Not on file   Years of education: Not on file   Highest education level: Not on file  Occupational History   Not on file  Tobacco Use   Smoking status: Never   Smokeless tobacco: Never  Vaping Use   Vaping Use: Never used  Substance and Sexual Activity   Alcohol use: Yes   Drug use: No   Sexual activity: Not on file  Other Topics Concern   Not on file  Social History Narrative   Married.   1 child.   Moved from [facility]   Works as a Sales executive.   Social Determinants of Health   Financial Resource Strain: Not on file  Food Insecurity: Not on file  Transportation Needs: Not on file  Physical Activity: Not on file  Stress: Not on file  Social Connections: Not on file  Intimate Partner Violence: Not on file    Review of Systems: See HPI, otherwise negative ROS  Physical Exam: BP 137/81   Pulse 99   Temp (!) 97.3 F (36.3 C) (Temporal)   Resp 18   Ht 5\' 3"  (1.6 m)   Wt 75.3 kg   SpO2 100%   BMI 29.41 kg/m  General:   Alert,  pleasant and cooperative in NAD Head:  Normocephalic and atraumatic.  Neck:  Supple; no masses or thyromegaly. Lungs:  Clear throughout to auscultation, normal respiratory effort.    Heart:  +S1, +S2, Regular rate and rhythm, No edema. Abdomen:  Soft, nontender and nondistended. Normal bowel sounds, without guarding, and without rebound.   Neurologic:  Alert and  oriented x4;  grossly normal neurologically.  Impression/Plan: Melissa Baldwin is here for an colonoscopy to be performed for Screening colonoscopy average risk   Risks, benefits, limitations, and alternatives regarding  colonoscopy have been reviewed with the patient.  Questions have been answered.  All parties agreeable.   Wyline Mood, MD  04/24/2023, 7:53 AM

## 2023-04-24 NOTE — Transfer of Care (Signed)
Immediate Anesthesia Transfer of Care Note  Patient: Melissa Baldwin  Procedure(s) Performed: COLONOSCOPY WITH PROPOFOL  Patient Location: PACU  Anesthesia Type:General  Level of Consciousness: awake and alert   Airway & Oxygen Therapy: Patient Spontanous Breathing and Patient connected to nasal cannula oxygen  Post-op Assessment: Report given to RN and Post -op Vital signs reviewed and stable  Post vital signs: Reviewed and stable  Last Vitals:  Vitals Value Taken Time  BP    Temp    Pulse    Resp    SpO2      Last Pain:  Vitals:   04/24/23 0716  TempSrc: Temporal  PainSc: 0-No pain         Complications: No notable events documented.

## 2023-04-24 NOTE — Op Note (Signed)
[facility] Gastroenterology Patient Name: Melissa Baldwin Procedure Date: 04/24/2023 7:34 AM MRN: [id number] Account #: [id number] Date of Birth: 10/26/1956 Admit Type: Outpatient Age: 67 Room: [facility] ENDO ROOM 1 Gender: Female Note Status: Finalized Instrument Name: Prentice Docker [id number] Procedure:             Colonoscopy Indications:           Screening for colorectal malignant neoplasm Providers:             Wyline Mood MD, MD Referring MD:          Doreene Nest (Referring MD) Medicines:             Monitored Anesthesia Care Complications:         No immediate complications. Procedure:             Pre-Anesthesia Assessment:                        - Prior to the procedure, a History and Physical was                         performed, and patient medications, allergies and                         sensitivities were reviewed. The patient's tolerance                         of previous anesthesia was reviewed.                        - The risks and benefits of the procedure and the                         sedation options and risks were discussed with the                         patient. All questions were answered and informed                         consent was obtained.                        - ASA Grade Assessment: II - A patient with mild                         systemic disease.                        After obtaining informed consent, the colonoscope was                         passed under direct vision. Throughout the procedure,                         the patient's blood pressure, pulse, and oxygen                         saturations were monitored continuously. The                         Colonoscope was introduced  through the anus and                         advanced to the the cecum, identified by the                         appendiceal orifice. The colonoscopy was performed                         with ease. The patient tolerated the procedure  well.                         The quality of the bowel preparation was good. The                         ileocecal valve, appendiceal orifice, and rectum were                         photographed. Findings:      The perianal and digital rectal examinations were normal.      Three sessile polyps were found in the ascending colon. The polyps were       5 to 7 mm in size. These polyps were removed with a cold snare.       Resection was complete, but the polyp tissue was only partially       retrieved.      A 5 mm polyp was found in the rectum. The polyp was sessile. The polyp       was removed with a cold snare. Resection and retrieval were complete.      Multiple medium-mouthed diverticula were found in the sigmoid colon.      The exam was otherwise without abnormality on direct and retroflexion       views. Impression:            - Three 5 to 7 mm polyps in the ascending colon,                         removed with a cold snare. Complete resection. Partial                         retrieval.                        - One 5 mm polyp in the rectum, removed with a cold                         snare. Resected and retrieved.                        - Diverticulosis in the sigmoid colon.                        - The examination was otherwise normal on direct and                         retroflexion views. Recommendation:        - Discharge patient to home (with escort).                        -  Resume previous diet.                        - Continue present medications.                        - Await pathology results.                        - Repeat colonoscopy for surveillance based on                         pathology results. Procedure Code(s):     --- Professional ---                        [phone], Colonoscopy, flexible; with removal of                         tumor(s), polyp(s), or other lesion(s) by snare                         technique Diagnosis Code(s):     --- Professional ---                         Z12.11, Encounter for screening for malignant neoplasm                         of colon                        D12.2, Benign neoplasm of ascending colon                        D12.8, Benign neoplasm of rectum                        K57.30, Diverticulosis of large intestine without                         perforation or abscess without bleeding CPT copyright 2022 American Medical Association. All rights reserved. The codes documented in this report are preliminary and upon coder review may  be revised to meet current compliance requirements. Wyline Mood, MD Wyline Mood MD, MD 04/24/2023 8:16:08 AM This report has been signed electronically. Number of Addenda: 0 Note Initiated On: 04/24/2023 7:34 AM Scope Withdrawal Time: 0 hours 11 minutes 59 seconds  Total Procedure Duration: 0 hours 14 minutes 13 seconds  Estimated Blood Loss:  Estimated blood loss: none.      [facility]

## 2023-04-24 NOTE — Anesthesia Postprocedure Evaluation (Signed)
Anesthesia Post Note  Patient: Melissa Baldwin  Procedure(s) Performed: COLONOSCOPY WITH PROPOFOL  Patient location during evaluation: PACU Anesthesia Type: General Level of consciousness: awake and alert, oriented and patient cooperative Pain management: pain level controlled Vital Signs Assessment: post-procedure vital signs reviewed and stable Respiratory status: spontaneous breathing, nonlabored ventilation and respiratory function stable Cardiovascular status: blood pressure returned to baseline and stable Postop Assessment: adequate PO intake Anesthetic complications: no   No notable events documented.   Last Vitals:  Vitals:   04/24/23 0839 04/24/23 0840  BP: 127/71 127/71  Pulse: 84 87  Resp: 11 13  Temp:    SpO2: 100% 99%    Last Pain:  Vitals:   04/24/23 0716  TempSrc: Temporal  PainSc: 0-No pain                 Reed Breech

## 2023-04-25 ENCOUNTER — Encounter: Payer: Self-pay | Admitting: Gastroenterology

## 2023-04-25 LAB — SURGICAL PATHOLOGY

## 2023-06-12 HISTORY — PX: MOHS SURGERY: SHX181

## 2023-06-26 ENCOUNTER — Ambulatory Visit
Admission: RE | Admit: 2023-06-26 | Discharge: 2023-06-26 | Disposition: A | Discharge status: Home / Self Care | Payer: Medicare Other | Source: Ambulatory Visit | Attending: Primary Care | Admitting: Primary Care

## 2023-06-26 DIAGNOSIS — Z1231 Encounter for screening mammogram for malignant neoplasm of breast: Secondary | ICD-10-CM | POA: Diagnosis present

## 2023-07-12 ENCOUNTER — Encounter (INDEPENDENT_AMBULATORY_CARE_PROVIDER_SITE_OTHER): Payer: Self-pay

## 2023-07-25 ENCOUNTER — Ambulatory Visit (INDEPENDENT_AMBULATORY_CARE_PROVIDER_SITE_OTHER): Payer: Medicare Other | Admitting: Primary Care

## 2023-07-25 ENCOUNTER — Encounter: Payer: Self-pay | Admitting: Primary Care

## 2023-07-25 VITALS — BP 138/70 | HR 96 | Temp 97.8°F | Ht 63.0 in | Wt 167.2 lb

## 2023-07-25 DIAGNOSIS — I10 Essential (primary) hypertension: Secondary | ICD-10-CM | POA: Diagnosis not present

## 2023-07-25 DIAGNOSIS — E785 Hyperlipidemia, unspecified: Secondary | ICD-10-CM

## 2023-07-25 DIAGNOSIS — E1165 Type 2 diabetes mellitus with hyperglycemia: Secondary | ICD-10-CM

## 2023-07-25 DIAGNOSIS — Z7984 Long term (current) use of oral hypoglycemic drugs: Secondary | ICD-10-CM

## 2023-07-25 LAB — COMPREHENSIVE METABOLIC PANEL
ALT: 22 U/L (ref 0–35)
AST: 16 U/L (ref 0–37)
Albumin: 4.5 g/dL (ref 3.5–5.2)
Alkaline Phosphatase: 104 U/L (ref 39–117)
BUN: 12 mg/dL (ref 6–23)
CO2: 30 mEq/L (ref 19–32)
Calcium: 9.9 mg/dL (ref 8.4–10.5)
Chloride: 99 mEq/L (ref 96–112)
Creatinine, Ser: 0.61 mg/dL (ref 0.40–1.20)
GFR: 92.67 mL/min (ref >60.00)
Glucose, Bld: 146 mg/dL — ABNORMAL HIGH (ref 70–99)
Potassium: 4 mEq/L (ref 3.5–5.1)
Sodium: 136 mEq/L (ref 135–145)
Total Bilirubin: 0.6 mg/dL (ref 0.2–1.2)
Total Protein: 7.6 g/dL (ref 6.0–8.3)

## 2023-07-25 LAB — LIPID PANEL
Cholesterol: 189 mg/dL (ref 0–200)
HDL: 56.9 mg/dL (ref >39.00)
LDL Cholesterol: 103 mg/dL — ABNORMAL HIGH (ref 0–99)
NonHDL: 131.97
Total CHOL/HDL Ratio: 3
Triglycerides: 144 mg/dL (ref 0.0–149.0)
VLDL: 28.8 mg/dL (ref 0.0–40.0)

## 2023-07-25 LAB — HEMOGLOBIN A1C: Hgb A1c MFr Bld: 7.4 % — ABNORMAL HIGH (ref 4.6–6.5)

## 2023-07-25 MED ORDER — METFORMIN HCL ER 500 MG PO TB24
500.0000 mg | ORAL_TABLET | Freq: Every day | ORAL | 1 refills | Status: DC
Start: 2023-07-25 — End: 2024-02-01

## 2023-07-25 MED ORDER — ATORVASTATIN CALCIUM 40 MG PO TABS
ORAL_TABLET | ORAL | 3 refills | Status: DC
Start: 2023-07-25 — End: 2024-07-30

## 2023-07-25 MED ORDER — LISINOPRIL 10 MG PO TABS: ORAL_TABLET | ORAL | 3 refills | Status: DC

## 2023-07-25 NOTE — Assessment & Plan Note (Signed)
Repeat A1c pending.  Continue metformin XR 500 mg daily.  Follow-up in 6 months.

## 2023-07-25 NOTE — Progress Notes (Signed)
Subjective:    Patient ID: Melissa Baldwin, female    DOB: 02-04-1956, 67 y.o.   MRN: [id number]  HPI  Roisin Vilhauer is a very pleasant 67 y.o. female with a history of hypertension, type 2 diabetes, hyperlipidemia who presents today for follow-up of chronic conditions.  Immunizations: -Shingles: Completed Shingrix series -Pneumonia: Completed Prevnar 20 in 2022  Mammogram: Completed in July 2024 Bone Density Scan: Completed in October 2022  Colonoscopy: Completed in May 2024, due 2027   1) Type 2 Diabetes:   Current medications include: Metformin XR 500 mg daily  Last A1C: 7.0 in February 2024, due today Last Eye Exam: UTD Last Foot Exam: TD Pneumonia Vaccination: 2022 Urine Microalbumin: UTD Statin: Atorvastatin  Dietary changes since last visit: She has cut back on portion sizes.    Exercise: Active at home.   2) Hypertension: Currently managed on lisinopril 10 mg daily. She denies chest pain, dizziness, headaches.   BP Readings from Last 3 Encounters:  07/25/23 138/70  04/24/23 127/71  01/20/23 (!) 148/82   3) Hyperlipidemia: Currently managed on atorvastatin 40 mg daily. She is due for repeat lipid panel today.   Review of Systems  Respiratory:  Negative for shortness of breath.   Cardiovascular:  Negative for chest pain.  Gastrointestinal:  Negative for constipation and diarrhea.  Neurological:  Negative for dizziness and headaches.  Psychiatric/Behavioral:  The patient is not nervous/anxious.          Past Medical History:  Diagnosis Date   Diabetes mellitus without complication (HCC)    Hyperlipidemia    Hypertension    Seasonal allergies     Social History   Socioeconomic History   Marital status: Married    Spouse name: Not on file   Number of children: Not on file   Years of education: Not on file   Highest education level: Not on file  Occupational History   Not on file  Tobacco Use   Smoking status: Never   Smokeless  tobacco: Never  Vaping Use   Vaping status: Never Used  Substance and Sexual Activity   Alcohol use: Yes   Drug use: No   Sexual activity: Not on file  Other Topics Concern   Not on file  Social History Narrative   Married.   1 child.   Moved from [facility]   Works as a Sales executive.   Social Determinants of Health   Financial Resource Strain: Not on file  Food Insecurity: Not on file  Transportation Needs: Not on file  Physical Activity: Not on file  Stress: Not on file  Social Connections: Not on file  Intimate Partner Violence: Not on file    Past Surgical History:  Procedure Laterality Date   BREAST BIOPSY Right    neg   BREAST CYST ASPIRATION Left    BREAST EXCISIONAL BIOPSY     BREAST LUMPECTOMY Right    fibrocystic ? early 90's   COLONOSCOPY WITH PROPOFOL N/A 04/27/2017   Procedure: COLONOSCOPY WITH PROPOFOL;  Surgeon: Wyline Mood, MD;  Location: [facility] ENDOSCOPY;  Service: Endoscopy;  Laterality: N/A;   COLONOSCOPY WITH PROPOFOL N/A 04/24/2023   Procedure: COLONOSCOPY WITH PROPOFOL;  Surgeon: Wyline Mood, MD;  Location: [facility] ENDOSCOPY;  Service: Gastroenterology;  Laterality: N/A;   MOHS SURGERY  06/2023   TONSILLECTOMY     UTERINE FIBROID SURGERY      Family History  Problem Relation Age of Onset   Alzheimer's disease Mother    Diabetes Paternal  Grandfather    Breast cancer Neg Hx     Allergies  Allergen Reactions   Penicillins     Current Outpatient Medications on File Prior to Visit  Medication Sig Dispense Refill   loratadine (CLARITIN) 10 MG tablet Take 10 mg by mouth daily.     meloxicam (MOBIC) 7.5 MG tablet Take 7.5 mg by mouth daily.     No current facility-administered medications on file prior to visit.    BP 138/70   Pulse 96   Temp 97.8 F (36.6 C) (Temporal)   Ht 5\' 3"  (1.6 m)   Wt 167 lb 3.2 oz (75.8 kg)   SpO2 97%   BMI 29.62 kg/m  Objective:   Physical Exam Cardiovascular:     Rate and Rhythm: Normal rate and regular  rhythm.  Pulmonary:     Effort: Pulmonary effort is normal.     Breath sounds: Normal breath sounds.  Musculoskeletal:     Cervical back: Neck supple.  Skin:    General: Skin is warm and dry.  Neurological:     Mental Status: She is alert.  Psychiatric:        Mood and Affect: Mood normal.           Assessment & Plan:  Type 2 diabetes mellitus with hyperglycemia, without long-term current use of insulin (HCC) Assessment & Plan: Repeat A1c pending.  Continue metformin XR 500 mg daily.  Follow-up in 6 months.  Orders: -     metFORMIN HCl ER; Take 1 tablet (500 mg total) by mouth daily with breakfast. for diabetes.  Dispense: 90 tablet; Refill: 1 -     Hemoglobin A1c  Hyperlipidemia, unspecified hyperlipidemia type Assessment & Plan: Repeat lipid panel pending.  Continue atorvastatin 40 mg daily.  Orders: -     Atorvastatin Calcium; TAKE 1 TABLET(40 MG) BY MOUTH EVERY EVENING FOR CHOLESTEROL  Dispense: 90 tablet; Refill: 3 -     Lipid panel  Essential hypertension Assessment & Plan: Overall controlled.  Continue lisinopril 10 mg daily. CMP pending.  Orders: -     Lisinopril; TAKE 1 TABLET BY MOUTH EVERY DAY FOR BLOOD PRESSURE  Dispense: 90 tablet; Refill: 3 -     Comprehensive metabolic panel        Doreene Nest, NP

## 2023-07-25 NOTE — Assessment & Plan Note (Signed)
Repeat lipid panel pending. Continue atorvastatin 40 mg daily.

## 2023-07-25 NOTE — Assessment & Plan Note (Signed)
Overall controlled.  Continue lisinopril 10 mg daily. CMP pending.

## 2023-07-25 NOTE — Patient Instructions (Signed)
Stop by the lab prior to leaving today. I will notify you of your results once received.   Please schedule a follow up visit for 6 months for a diabetes check.  It was a pleasure to see you today!   

## 2023-11-27 ENCOUNTER — Encounter: Payer: Self-pay | Admitting: Primary Care

## 2023-11-27 NOTE — Telephone Encounter (Signed)
 Care team updated and letter sent for eye exam notes.

## 2023-12-01 ENCOUNTER — Ambulatory Visit (INDEPENDENT_AMBULATORY_CARE_PROVIDER_SITE_OTHER): Payer: Medicare Other

## 2023-12-01 VITALS — BP 128/62 | Ht 63.0 in | Wt 172.2 lb

## 2023-12-01 DIAGNOSIS — Z0000 Encounter for general adult medical examination without abnormal findings: Secondary | ICD-10-CM | POA: Diagnosis not present

## 2023-12-01 NOTE — Progress Notes (Signed)
Subjective:   Melissa Baldwin is a 67 y.o. female who presents for an Initial Medicare Annual Wellness Visit.  Visit Complete: In person  Patient Medicare AWV questionnaire was completed by the patient on 11/29/2023; I have confirmed that all information answered by patient is correct and no changes since this date.  Cardiac Risk Factors include: advanced age (>45men, >70 women);dyslipidemia;hypertension;obesity (BMI >30kg/m2)    Objective:    Today's Vitals   12/01/23 0957  BP: 128/62  Weight: 172 lb 3.2 oz (78.1 kg)  Height: 5\' 3"  (1.6 m)  PainSc: 0-No pain   Body mass index is 30.5 kg/m.     12/01/2023   10:08 AM 04/24/2023    7:14 AM 04/27/2017    6:57 AM  Advanced Directives  Does Patient Have a Medical Advance Directive? Yes Yes No  Type of Estate agent of [place];Living will    Copy of Healthcare Power of Attorney in Chart? No - copy requested    Would patient like information on creating a medical advance directive?   No - Patient declined    Current Medications (verified) Outpatient Encounter Medications as of 12/01/2023  Medication Sig   atorvastatin (LIPITOR) 40 MG tablet TAKE 1 TABLET(40 MG) BY MOUTH EVERY EVENING FOR CHOLESTEROL   lisinopril (ZESTRIL) 10 MG tablet TAKE 1 TABLET BY MOUTH EVERY DAY FOR BLOOD PRESSURE   loratadine (CLARITIN) 10 MG tablet Take 10 mg by mouth daily.   meloxicam (MOBIC) 7.5 MG tablet Take 7.5 mg by mouth daily.   metFORMIN (GLUCOPHAGE-XR) 500 MG 24 hr tablet Take 1 tablet (500 mg total) by mouth daily with breakfast. for diabetes.   No facility-administered encounter medications on file as of 12/01/2023.    Allergies (verified) Penicillins   History: Past Medical History:  Diagnosis Date   Diabetes mellitus without complication (HCC)    Hyperlipidemia    Hypertension    Seasonal allergies    Past Surgical History:  Procedure Laterality Date   BREAST BIOPSY Right    neg   BREAST CYST  ASPIRATION Left    BREAST EXCISIONAL BIOPSY     BREAST LUMPECTOMY Right    fibrocystic ? early 90's   COLONOSCOPY WITH PROPOFOL N/A 04/27/2017   Procedure: COLONOSCOPY WITH PROPOFOL;  Surgeon: Wyline Mood, MD;  Location: [facility] ENDOSCOPY;  Service: Endoscopy;  Laterality: N/A;   COLONOSCOPY WITH PROPOFOL N/A 04/24/2023   Procedure: COLONOSCOPY WITH PROPOFOL;  Surgeon: Wyline Mood, MD;  Location: [facility] ENDOSCOPY;  Service: Gastroenterology;  Laterality: N/A;   MOHS SURGERY  06/2023   TONSILLECTOMY     UTERINE FIBROID SURGERY     Family History  Problem Relation Age of Onset   Alzheimer's disease Mother    Diabetes Paternal Grandfather    Breast cancer Neg Hx    Social History   Socioeconomic History   Marital status: Married    Spouse name: Not on file   Number of children: Not on file   Years of education: Not on file   Highest education level: Not on file  Occupational History   Not on file  Tobacco Use   Smoking status: Never   Smokeless tobacco: Never  Vaping Use   Vaping status: Never Used  Substance and Sexual Activity   Alcohol use: Yes   Drug use: No   Sexual activity: Not on file  Other Topics Concern   Not on file  Social History Narrative   Married.   1 child.   Moved from  [place]   Works as a Sales executive.   Social Drivers of Corporate investment banker Strain: Low Risk  (12/01/2023)   Overall Financial Resource Strain (CARDIA)    Difficulty of Paying Living Expenses: Not hard at all  Food Insecurity: No Food Insecurity (12/01/2023)   Hunger Vital Sign    Worried About Running Out of Food in the Last Year: Never true    Ran Out of Food in the Last Year: Never true  Transportation Needs: No Transportation Needs (12/01/2023)   PRAPARE - Administrator, Civil Service (Medical): No    Lack of Transportation (Non-Medical): No  Physical Activity: Sufficiently Active (12/01/2023)   Exercise Vital Sign    Days of Exercise per Week: 5 days     Minutes of Exercise per Session: 60 min  Stress: No Stress Concern Present (12/01/2023)   [employer] of Occupational Health - Occupational Stress Questionnaire    Feeling of Stress : Not at all  Social Connections: Moderately Integrated (12/01/2023)   Social Connection and Isolation Panel [NHANES]    Frequency of Communication with Friends and Family: More than three times a week    Frequency of Social Gatherings with Friends and Family: Three times a week    Attends Religious Services: 1 to 4 times per year    Active Member of Clubs or Organizations: No    Attends Banker Meetings: Never    Marital Status: Married    Tobacco Counseling Counseling given: Not Answered   Clinical Intake:  Pre-visit preparation completed: No  Pain : No/denies pain Pain Score: 0-No pain     BMI - recorded: 30.5 Nutritional Status: BMI > 30  Obese Nutritional Risks: None Diabetes: Yes CBG done?: No Did pt. bring in CBG monitor from home?: No  How often do you need to have someone help you when you read instructions, pamphlets, or other written materials from your doctor or pharmacy?: 1 - Never  Interpreter Needed?: No  Comments: lives with husband Information entered by :: B.Latise Dilley,LPN   Activities of Daily Living    12/01/2023   10:23 AM 11/29/2023   10:14 AM  In your present state of health, do you have any difficulty performing the following activities:  Hearing? 0 0  Vision? 0 0  Difficulty concentrating or making decisions? 0 0  Walking or climbing stairs? 0 0  Dressing or bathing? 0 0  Doing errands, shopping? 0 0  Preparing Food and eating ? N N  Using the Toilet? N N  In the past six months, have you accidently leaked urine? N N  Do you have problems with loss of bowel control? N N  Managing your Medications? N N  Managing your Finances? N N  Housekeeping or managing your Housekeeping? N N    Patient Care Team: Doreene Nest, NP as PCP -  General (Internal Medicine) [facility]  Indicate any recent Medical Services you may have received from other than [facility] providers in the past year (date may be approximate).     Assessment:   This is a routine wellness examination for Melissa Baldwin.  Hearing/Vision screen Hearing Screening - Comments:: Pt says her hearing is good Vision Screening - Comments:: Pt says her vision is good w/glasses [facility]   Goals Addressed             This Visit's Progress    Patient Stated  12/01/23-I would like to lose 10 more lbs and exercise more       Depression Screen    12/01/2023   10:03 AM 01/20/2023    8:43 AM 07/19/2022    8:52 AM 07/15/2021    9:00 AM 01/13/2021    8:46 AM  PHQ 2/9 Scores  PHQ - 2 Score 0 0 0 0 0  PHQ- 9 Score  0 0 0     Fall Risk    12/01/2023    9:59 AM 11/29/2023   10:14 AM 01/20/2023    8:43 AM 07/19/2022    8:53 AM 07/15/2021    9:00 AM  Fall Risk   Falls in the past year? 1 1 1 1 1   Number falls in past yr: 0 0 0 0 0  Injury with Fall? 1 1 1 1 1   Risk for fall due to : No Fall Risks  History of fall(s)  History of fall(s)  Follow up Education provided;Falls prevention discussed  Falls evaluation completed Falls evaluation completed     MEDICARE RISK AT HOME: Medicare Risk at Home Any stairs in or around the home?: Yes If so, are there any without handrails?: No Home free of loose throw rugs in walkways, pet beds, electrical cords, etc?: No Adequate lighting in your home to reduce risk of falls?: Yes Life alert?: No Use of a cane, walker or w/c?: No Grab bars in the bathroom?: No Shower chair or bench in shower?: No Elevated toilet seat or a handicapped toilet?: No  TIMED UP AND GO:  Was the test performed? Yes  Length of time to ambulate 10 feet: 10 sec Gait steady and fast without use of assistive device    Cognitive Function:        12/01/2023   10:12 AM  6CIT Screen  What Year? 0 points  What  month? 0 points  What time? 0 points  Count back from 20 0 points  Months in reverse 0 points  Repeat phrase 0 points  Total Score 0 points    Immunizations Immunization History  Administered Date(s) Administered   Fluad Quad(high Dose 65+) 09/17/2022   Influenza,inj,Quad PF,6+ Mos 08/23/2018, 09/04/2019, 09/23/2020   Influenza-Unspecified 09/29/2021, 09/20/2023   Moderna Sars-Covid-2 Vaccination 02/23/2020, 03/25/2020, 11/28/2020, 04/26/2021   PNEUMOCOCCAL CONJUGATE-20 07/15/2021   Pneumococcal Polysaccharide-23 02/15/2018   Td 01/26/2012   Unspecified SARS-COV-2 Vaccination 09/20/2023   Zoster Recombinant(Shingrix) 09/04/2019, 11/13/2019    TDAP status: Up to date  Flu Vaccine status: Up to date  Pneumococcal vaccine status: Up to date  Covid-19 vaccine status: Completed vaccines  Qualifies for Shingles Vaccine? Yes   Zostavax completed Yes   Shingrix Completed?: Yes  Screening Tests Health Maintenance  Topic Date Due   OPHTHALMOLOGY EXAM  03/11/2024 (Originally 05/05/2023)   Diabetic kidney evaluation - Urine ACR  01/21/2024   FOOT EXAM  01/21/2024   HEMOGLOBIN A1C  01/25/2024   Diabetic kidney evaluation - eGFR measurement  07/24/2024   Medicare Annual Wellness (AWV)  11/30/2024   MAMMOGRAM  06/25/2025   Colonoscopy  04/23/2026   Pneumonia Vaccine 24+ Years old  Completed   INFLUENZA VACCINE  Completed   DEXA SCAN  Completed   Hepatitis C Screening  Completed   Zoster Vaccines- Shingrix  Completed   HPV VACCINES  Aged Out   DTaP/Tdap/Td  Discontinued   COVID-19 Vaccine  Discontinued    Health Maintenance  There are no preventive care reminders to display for this patient.  Colorectal cancer screening: Type of screening: Colonoscopy. Completed 04/24/23. Repeat every 3 years  Mammogram status: Completed 06/26/23. Repeat every year  Bone Density status: Completed 10/31/2021. Results reflect: Bone density results: NORMAL. Repeat every 5 years.  Lung  Cancer Screening: (Low Dose CT Chest recommended if Age 66-80 years, 20 pack-year currently smoking OR have quit w/in 15years.) does not qualify.   Lung Cancer Screening Referral: no  Additional Screening:  Hepatitis C Screening: does not qualify; Completed 06/07/2019  Vision Screening: Recommended annual ophthalmology exams for early detection of glaucoma and other disorders of the eye. Is the patient up to date with their annual eye exam?  Yes  Who is the provider or what is the name of the office in which the patient attends annual eye exams? [facility] If pt is not established with a provider, would they like to be referred to a provider to establish care? No .   Dental Screening: Recommended annual dental exams for proper oral hygiene  Diabetic Foot Exam: Diabetic Foot Exam: Completed 01/20/23  Community Resource Referral / Chronic Care Management: CRR required this visit?  No   CCM required this visit?  No    Plan:     I have personally reviewed and noted the following in the patient's chart:   Medical and social history Use of alcohol, tobacco or illicit drugs  Current medications and supplements including opioid prescriptions. Patient is not currently taking opioid prescriptions. Functional ability and status Nutritional status Physical activity Advanced directives List of other physicians Hospitalizations, surgeries, and ER visits in previous 12 months Vitals Screenings to include cognitive, depression, and falls Referrals and appointments  In addition, I have reviewed and discussed with patient certain preventive protocols, quality metrics, and best practice recommendations. A written personalized care plan for preventive services as well as general preventive health recommendations were provided to patient.    Sue Lush, LPN   [id number]/09/[id number]   After Visit Summary: (MyChart) Due to this being a telephonic visit, the after visit summary with patients  personalized plan was offered to patient via MyChart   Nurse Notes: The patient states she is doing well and has no concerns or questions at this time.

## 2023-12-01 NOTE — Patient Instructions (Signed)
Melissa Baldwin , Thank you for taking time to come for your Medicare Wellness Visit. I appreciate your ongoing commitment to your health goals. Please review the following plan we discussed and let me know if I can assist you in the future.   Referrals/Orders/Follow-Ups/Clinician Recommendations: none  This is a list of the screening recommended for you and due dates:  Health Maintenance  Topic Date Due   Eye exam for diabetics  05/05/2023   Yearly kidney health urinalysis for diabetes  01/21/2024   Complete foot exam   01/21/2024   Hemoglobin A1C  01/25/2024   Yearly kidney function blood test for diabetes  07/24/2024   Medicare Annual Wellness Visit  11/30/2024   Mammogram  06/25/2025   Colon Cancer Screening  04/23/2026   Pneumonia Vaccine  Completed   Flu Shot  Completed   DEXA scan (bone density measurement)  Completed   Hepatitis C Screening  Completed   Zoster (Shingles) Vaccine  Completed   HPV Vaccine  Aged Out   DTaP/Tdap/Td vaccine  Discontinued   COVID-19 Vaccine  Discontinued    Advanced directives: (Copy Requested) Please bring a copy of your health care power of attorney and living will to the office to be added to your chart at your convenience.  Next Medicare Annual Wellness Visit scheduled for next year: Yes 12/03/24 @ 10:10am in person

## 2024-01-30 ENCOUNTER — Encounter: Payer: Self-pay | Admitting: Primary Care

## 2024-01-30 ENCOUNTER — Ambulatory Visit (INDEPENDENT_AMBULATORY_CARE_PROVIDER_SITE_OTHER): Payer: Medicare Other | Admitting: Primary Care

## 2024-01-30 VITALS — BP 134/76 | HR 100 | Temp 98.8°F | Ht 63.0 in | Wt 172.0 lb

## 2024-01-30 DIAGNOSIS — E1165 Type 2 diabetes mellitus with hyperglycemia: Secondary | ICD-10-CM

## 2024-01-30 DIAGNOSIS — Z1231 Encounter for screening mammogram for malignant neoplasm of breast: Secondary | ICD-10-CM

## 2024-01-30 DIAGNOSIS — E2839 Other primary ovarian failure: Secondary | ICD-10-CM

## 2024-01-30 DIAGNOSIS — Z7984 Long term (current) use of oral hypoglycemic drugs: Secondary | ICD-10-CM | POA: Diagnosis not present

## 2024-01-30 LAB — POCT GLYCOSYLATED HEMOGLOBIN (HGB A1C): Hemoglobin A1C: 7 % — AB (ref 4.0–5.6)

## 2024-01-30 LAB — MICROALBUMIN / CREATININE URINE RATIO
Creatinine,U: 110.1 mg/dL
Microalb Creat Ratio: 7.6 mg/g (ref 0.0–30.0)
Microalb, Ur: 0.8 mg/dL (ref 0.0–1.9)

## 2024-01-30 NOTE — Progress Notes (Signed)
Established Patient Office Visit  Subjective   Patient ID: Melissa Baldwin, female    DOB: 08/18/1956  Age: 68 y.o. MRN: [id number]  Chief Complaint  Patient presents with   Medical Management of Chronic Issues    HPI Melissa Baldwin is a 68 year old female with history of hypertension, type 2 diabetes mellitus, hyperlipidemia who presents today for routine diabetes follow up.   She denies polyuria, polyphagia, polydipsia. She denies diarrhea and constipation. She denies numbness/tingling to lower extremities. She reports medication adherence without side effects. She does not check blood sugars at home.  Type 2 diabetes mellitus:  Medications: Metformin XR 500mg  QAM Statin: atorvastatin 40mg  every day  Last A1c: 7.4% - 07/25/23, due today  Foot exam: due today  Microalbumin/creatinine ratio, urine: due today  Lipid panel: completed 07/25/23 - LDL 103  Bone Density Screening: due   Diet currently consists of: Late breakfast: whole grain cereal, egg, toast Dinner: chicken, acorn squash, steak, sweet potato Snacks: occasionally - crackers Desserts: occasionally  Beverages: unsweet tea, water, diet soda once weekly  Exercise: walks 2 miles daily with her 2 dogs; free weights 3x/weekly at home   BP Readings from Last 3 Encounters:  01/30/24 134/76  12/01/23 128/62  07/25/23 138/70   Wt Readings from Last 3 Encounters:  01/30/24 78 kg  12/01/23 78.1 kg  07/25/23 75.8 kg    Review of Systems  Constitutional: Negative.   Eyes: Negative.   Cardiovascular:  Negative for chest pain and palpitations.  Gastrointestinal:  Negative for constipation and diarrhea.  Genitourinary:  Negative for frequency.  Neurological:  Negative for tingling and sensory change.      Objective:     BP 134/76   Pulse 100   Temp 98.8 F (37.1 C) (Temporal)   Ht 5\' 3"  (1.6 m)   Wt 78 kg   SpO2 98%   BMI 30.47 kg/m    Physical Exam Constitutional:      Appearance: Normal appearance.   Cardiovascular:     Rate and Rhythm: Normal rate and regular rhythm.     Pulses: Normal pulses.          Radial pulses are 2+ on the right side and 2+ on the left side.       Dorsalis pedis pulses are 2+ on the right side and 2+ on the left side.       Posterior tibial pulses are 2+ on the right side and 2+ on the left side.     Heart sounds: Normal heart sounds, S1 normal and S2 normal.  Pulmonary:     Effort: Pulmonary effort is normal.     Breath sounds: Normal breath sounds.  Musculoskeletal:     Cervical back: Normal range of motion and neck supple.     Right lower leg: No edema.     Left lower leg: No edema.  Feet:     Right foot:     Skin integrity: Skin integrity normal. No skin breakdown.     Left foot:     Skin integrity: Skin integrity normal. No skin breakdown.  Skin:    General: Skin is warm and dry.  Neurological:     General: No focal deficit present.     Mental Status: She is alert and oriented to person, place, and time.  Psychiatric:        Mood and Affect: Mood normal.        Behavior: Behavior normal.        Thought  Content: Thought content normal.        Judgment: Judgment normal.     Results for orders placed or performed in visit on 01/30/24  POCT glycosylated hemoglobin (Hb A1C)  Result Value Ref Range   Hemoglobin A1C 7.0 (A) 4.0 - 5.6 %   HbA1c POC (<> result, manual entry)     HbA1c, POC (prediabetic range)     HbA1c, POC (controlled diabetic range)        The 10-year ASCVD risk score (Arnett DK, et al., 2019) is: 17.9%    Assessment & Plan:   Problem List Items Addressed This Visit       Endocrine   Type 2 diabetes mellitus with hyperglycemia (HCC) - Primary   Stable to improving  A1c today: 7.0% (down from 7.4% - 07/2023)  Discussed treatment options of increasing metformin dose to 1000mg  daily and/or lifestyle modifications.   Plan: continue metformin XR 500mg  daily, continue statin  Foot exam: completed Microalbumin/creatine  ratio, urine: ordered, results pending  Discussed importance of continued lifestyle modifications of reduction in simple carbohydrates, sweets, fried food, fatty food, sugary drinks and increasing physical activity.   Follow up in 6 months for routine physical.         Relevant Orders   POCT glycosylated hemoglobin (Hb A1C) (Completed)   Microalbumin/Creatinine Ratio, Urine   Other Visit Diagnoses       Estrogen deficiency       Relevant Orders   DG Bone Density     Encounter for screening mammogram for malignant neoplasm of breast       Relevant Orders   MM 3D SCREENING MAMMOGRAM BILATERAL BREAST       Follow up in 6 months for annual physical.    Lindell Spar, RN

## 2024-01-30 NOTE — Patient Instructions (Signed)
 Please schedule a physical to meet with me in 6 months.   It was a pleasure to see you today!

## 2024-01-30 NOTE — Progress Notes (Signed)
Subjective:    Patient ID: Melissa Baldwin, female    DOB: 1956/10/10, 68 y.o.   MRN: [id number]  HPI  Melissa Baldwin is a very pleasant 68 y.o. female with a history of type 2 diabetes, hyperlipidemia, hypertension who presents today for follow-up of diabetes.  Current medications include: Metformin ER 500 mg daily  She is checking her blood glucose 0 times daily.  Last A1C: 7.4 in August 2024, 7.0 today. Last Eye Exam: Up-to-date Last Foot Exam: Due Pneumonia Vaccination: 2022 Urine Microalbumin: Due Statin: Atorvastatin  Diet currently consists of:  Breakfast: Cereal, egg, toast Lunch: Skips Dinner: Protein, veggie, starch Snacks: Crackers Desserts: Several times weekly over the holidays Beverages: Water, diet soda  Exercise: 3 days weekly at home, walks her dog.   BP Readings from Last 3 Encounters:  01/30/24 134/76  12/01/23 128/62  07/25/23 138/70   Wt Readings from Last 3 Encounters:  01/30/24 172 lb (78 kg)  12/01/23 172 lb 3.2 oz (78.1 kg)  07/25/23 167 lb 3.2 oz (75.8 kg)      Review of Systems  Respiratory:  Negative for shortness of breath.   Cardiovascular:  Negative for chest pain.  Endocrine: Negative for polydipsia, polyphagia and polyuria.  Neurological:  Negative for numbness.         Past Medical History:  Diagnosis Date   Diabetes mellitus without complication (HCC)    Hyperlipidemia    Hypertension    Seasonal allergies     Social History   Socioeconomic History   Marital status: Married    Spouse name: Not on file   Number of children: Not on file   Years of education: Not on file   Highest education level: Associate degree: occupational, Scientist, product/process development, or vocational program  Occupational History   Not on file  Tobacco Use   Smoking status: Never   Smokeless tobacco: Never  Vaping Use   Vaping status: Never Used  Substance and Sexual Activity   Alcohol use: Yes   Drug use: No   Sexual activity: Not on file   Other Topics Concern   Not on file  Social History Narrative   Married.   1 child.   Moved from [facility]   Works as a Sales executive.   Social Drivers of Corporate investment banker Strain: Low Risk  (01/29/2024)   Overall Financial Resource Strain (CARDIA)    Difficulty of Paying Living Expenses: Not hard at all  Food Insecurity: No Food Insecurity (01/29/2024)   Hunger Vital Sign    Worried About Running Out of Food in the Last Year: Never true    Ran Out of Food in the Last Year: Never true  Transportation Needs: No Transportation Needs (01/29/2024)   PRAPARE - Administrator, Civil Service (Medical): No    Lack of Transportation (Non-Medical): No  Physical Activity: Inactive (01/29/2024)   Exercise Vital Sign    Days of Exercise per Week: 0 days    Minutes of Exercise per Session: 60 min  Stress: No Stress Concern Present (01/29/2024)   [employer] of Occupational Health - Occupational Stress Questionnaire    Feeling of Stress : Only a little  Social Connections: Socially Isolated (01/29/2024)   Social Connection and Isolation Panel [NHANES]    Frequency of Communication with Friends and Family: Once a week    Frequency of Social Gatherings with Friends and Family: Never    Attends Religious Services: Never    Production manager of [employer]  or Organizations: No    Attends Banker Meetings: Never    Marital Status: Married  Catering manager Violence: Not At Risk (12/01/2023)   Humiliation, Afraid, Rape, and Kick questionnaire    Fear of Current or Ex-Partner: No    Emotionally Abused: No    Physically Abused: No    Sexually Abused: No    Past Surgical History:  Procedure Laterality Date   BREAST BIOPSY Right    neg   BREAST CYST ASPIRATION Left    BREAST EXCISIONAL BIOPSY     BREAST LUMPECTOMY Right    fibrocystic ? early 90's   COLONOSCOPY WITH PROPOFOL N/A 04/27/2017   Procedure: COLONOSCOPY WITH PROPOFOL;  Surgeon: Wyline Mood, MD;   Location: [facility] ENDOSCOPY;  Service: Endoscopy;  Laterality: N/A;   COLONOSCOPY WITH PROPOFOL N/A 04/24/2023   Procedure: COLONOSCOPY WITH PROPOFOL;  Surgeon: Wyline Mood, MD;  Location: [facility] ENDOSCOPY;  Service: Gastroenterology;  Laterality: N/A;   MOHS SURGERY  06/2023   TONSILLECTOMY     UTERINE FIBROID SURGERY      Family History  Problem Relation Age of Onset   Alzheimer's disease Mother    Diabetes Paternal Grandfather    Breast cancer Neg Hx     Allergies  Allergen Reactions   Penicillins     Current Outpatient Medications on File Prior to Visit  Medication Sig Dispense Refill   atorvastatin (LIPITOR) 40 MG tablet TAKE 1 TABLET(40 MG) BY MOUTH EVERY EVENING FOR CHOLESTEROL 90 tablet 3   lisinopril (ZESTRIL) 10 MG tablet TAKE 1 TABLET BY MOUTH EVERY DAY FOR BLOOD PRESSURE 90 tablet 3   loratadine (CLARITIN) 10 MG tablet Take 10 mg by mouth daily.     meloxicam (MOBIC) 7.5 MG tablet Take 7.5 mg by mouth daily.     metFORMIN (GLUCOPHAGE-XR) 500 MG 24 hr tablet Take 1 tablet (500 mg total) by mouth daily with breakfast. for diabetes. 90 tablet 1   No current facility-administered medications on file prior to visit.    BP 134/76   Pulse 100   Temp 98.8 F (37.1 C) (Temporal)   Ht 5\' 3"  (1.6 m)   Wt 172 lb (78 kg)   SpO2 98%   BMI 30.47 kg/m  Objective:   Physical Exam Cardiovascular:     Rate and Rhythm: Normal rate and regular rhythm.  Pulmonary:     Effort: Pulmonary effort is normal.     Breath sounds: Normal breath sounds.  Musculoskeletal:     Cervical back: Neck supple.  Skin:    General: Skin is warm and dry.  Neurological:     Mental Status: She is alert and oriented to person, place, and time.  Psychiatric:        Mood and Affect: Mood normal.           Assessment & Plan:  Type 2 diabetes mellitus with hyperglycemia, without long-term current use of insulin (HCC) Assessment & Plan: Improving  A1c today: 7.0% (down from 7.4% -  07/2023)  Discussed treatment options of increasing metformin dose to 1000mg  daily and/or lifestyle modifications. She would like to continue metformin ER 500 mg daily.  Plan: continue metformin XR 500mg  daily, continue statin  Foot exam: completed Microalbumin/creatine ratio, urine: ordered, results pending  Discussed importance of continued lifestyle modifications of reduction in simple carbohydrates, sweets, fried food, fatty food, sugary drinks and increasing physical activity.   Follow up in 6 months for routine physical.   I evaluated patient, was consulted  regarding treatment, and agree with assessment and plan per Julaine Fusi, MSN, FNP student.   Mayra Reel, NP-C     Orders: -     POCT glycosylated hemoglobin (Hb A1C) -     Microalbumin / creatinine urine ratio  Estrogen deficiency -     DG Bone Density; Future  Encounter for screening mammogram for malignant neoplasm of breast -     3D Screening Mammogram, Left and Right; Future        Doreene Nest, NP

## 2024-01-30 NOTE — Assessment & Plan Note (Addendum)
Improving  A1c today: 7.0% (down from 7.4% - 07/2023)  Discussed treatment options of increasing metformin dose to 1000mg  daily and/or lifestyle modifications. She would like to continue metformin ER 500 mg daily.  Plan: continue metformin XR 500mg  daily, continue statin  Foot exam: completed Microalbumin/creatine ratio, urine: ordered, results pending  Discussed importance of continued lifestyle modifications of reduction in simple carbohydrates, sweets, fried food, fatty food, sugary drinks and increasing physical activity.   Follow up in 6 months for routine physical.   I evaluated patient, was consulted regarding treatment, and agree with assessment and plan per Julaine Fusi, MSN, FNP student.   Mayra Reel, NP-C

## 2024-02-01 ENCOUNTER — Other Ambulatory Visit: Payer: Self-pay | Admitting: Primary Care

## 2024-02-01 DIAGNOSIS — E1165 Type 2 diabetes mellitus with hyperglycemia: Secondary | ICD-10-CM

## 2024-05-07 ENCOUNTER — Ambulatory Visit: Payer: TRICARE For Life (TFL) | Admitting: Dermatology

## 2024-05-23 ENCOUNTER — Ambulatory Visit
Admission: RE | Admit: 2024-05-23 | Discharge: 2024-05-23 | Disposition: A | Discharge status: Home / Self Care | Source: Ambulatory Visit | Attending: Primary Care | Admitting: Primary Care

## 2024-05-23 ENCOUNTER — Ambulatory Visit: Payer: Self-pay | Admitting: Primary Care

## 2024-05-23 DIAGNOSIS — E2839 Other primary ovarian failure: Secondary | ICD-10-CM | POA: Insufficient documentation

## 2024-05-28 LAB — HM DIABETES EYE EXAM

## 2024-06-20 ENCOUNTER — Other Ambulatory Visit: Payer: Self-pay | Admitting: Medical Genetics

## 2024-06-26 ENCOUNTER — Ambulatory Visit
Admission: RE | Admit: 2024-06-26 | Discharge: 2024-06-26 | Disposition: A | Discharge status: Home / Self Care | Source: Ambulatory Visit | Attending: Primary Care | Admitting: Primary Care

## 2024-06-26 DIAGNOSIS — Z1231 Encounter for screening mammogram for malignant neoplasm of breast: Secondary | ICD-10-CM | POA: Insufficient documentation

## 2024-06-27 ENCOUNTER — Other Ambulatory Visit

## 2024-06-27 DIAGNOSIS — Z006 Encounter for examination for normal comparison and control in clinical research program: Secondary | ICD-10-CM

## 2024-07-01 ENCOUNTER — Other Ambulatory Visit: Payer: Self-pay | Admitting: Primary Care

## 2024-07-01 DIAGNOSIS — R928 Other abnormal and inconclusive findings on diagnostic imaging of breast: Secondary | ICD-10-CM

## 2024-07-05 ENCOUNTER — Ambulatory Visit
Admission: RE | Admit: 2024-07-05 | Discharge: 2024-07-05 | Disposition: A | Discharge status: Home / Self Care | Source: Ambulatory Visit | Attending: Primary Care | Admitting: Primary Care

## 2024-07-05 DIAGNOSIS — R928 Other abnormal and inconclusive findings on diagnostic imaging of breast: Secondary | ICD-10-CM | POA: Diagnosis present

## 2024-07-07 ENCOUNTER — Ambulatory Visit: Payer: Self-pay | Admitting: Primary Care

## 2024-07-08 LAB — GENECONNECT MOLECULAR SCREEN: Genetic Analysis Overall Interpretation: NEGATIVE

## 2024-07-30 ENCOUNTER — Ambulatory Visit (INDEPENDENT_AMBULATORY_CARE_PROVIDER_SITE_OTHER): Payer: Medicare Other | Admitting: Primary Care

## 2024-07-30 ENCOUNTER — Ambulatory Visit (INDEPENDENT_AMBULATORY_CARE_PROVIDER_SITE_OTHER)
Admission: RE | Admit: 2024-07-30 | Discharge: 2024-07-30 | Disposition: A | Discharge status: Home / Self Care | Source: Ambulatory Visit | Attending: Primary Care | Admitting: Primary Care

## 2024-07-30 ENCOUNTER — Encounter: Payer: Self-pay | Admitting: Primary Care

## 2024-07-30 VITALS — BP 136/70 | HR 98 | Temp 97.8°F | Ht 63.0 in | Wt 165.0 lb

## 2024-07-30 DIAGNOSIS — E785 Hyperlipidemia, unspecified: Secondary | ICD-10-CM

## 2024-07-30 DIAGNOSIS — E1165 Type 2 diabetes mellitus with hyperglycemia: Secondary | ICD-10-CM | POA: Diagnosis not present

## 2024-07-30 DIAGNOSIS — M25551 Pain in right hip: Secondary | ICD-10-CM | POA: Diagnosis not present

## 2024-07-30 DIAGNOSIS — Z0184 Encounter for antibody response examination: Secondary | ICD-10-CM | POA: Insufficient documentation

## 2024-07-30 DIAGNOSIS — I10 Essential (primary) hypertension: Secondary | ICD-10-CM | POA: Diagnosis not present

## 2024-07-30 DIAGNOSIS — Z7984 Long term (current) use of oral hypoglycemic drugs: Secondary | ICD-10-CM

## 2024-07-30 LAB — COMPREHENSIVE METABOLIC PANEL WITH GFR
ALT: 20 U/L (ref 0–35)
AST: 16 U/L (ref 0–37)
Albumin: 4.6 g/dL (ref 3.5–5.2)
Alkaline Phosphatase: 101 U/L (ref 39–117)
BUN: 16 mg/dL (ref 6–23)
CO2: 29 meq/L (ref 19–32)
Calcium: 9.8 mg/dL (ref 8.4–10.5)
Chloride: 103 meq/L (ref 96–112)
Creatinine, Ser: 0.63 mg/dL (ref 0.40–1.20)
GFR: 91.3 mL/min (ref >60.00)
Glucose, Bld: 143 mg/dL — ABNORMAL HIGH (ref 70–99)
Potassium: 4.1 meq/L (ref 3.5–5.1)
Sodium: 140 meq/L (ref 135–145)
Total Bilirubin: 0.6 mg/dL (ref 0.2–1.2)
Total Protein: 8.1 g/dL (ref 6.0–8.3)

## 2024-07-30 LAB — LIPID PANEL
Cholesterol: 172 mg/dL (ref 0–200)
HDL: 57.2 mg/dL (ref >39.00)
LDL Cholesterol: 90 mg/dL (ref 0–99)
NonHDL: 115.02
Total CHOL/HDL Ratio: 3
Triglycerides: 124 mg/dL (ref 0.0–149.0)
VLDL: 24.8 mg/dL (ref 0.0–40.0)

## 2024-07-30 LAB — CBC WITH DIFFERENTIAL/PLATELET
Basophils Absolute: 0.1 K/uL (ref 0.0–0.1)
Basophils Relative: 0.5 % (ref 0.0–3.0)
Eosinophils Absolute: 0 K/uL (ref 0.0–0.7)
Eosinophils Relative: 0.2 % (ref 0.0–5.0)
HCT: 40.2 % (ref 36.0–46.0)
Hemoglobin: 13.3 g/dL (ref 12.0–15.0)
Lymphocytes Relative: 23 % (ref 12.0–46.0)
Lymphs Abs: 2.5 K/uL (ref 0.7–4.0)
MCHC: 33.2 g/dL (ref 30.0–36.0)
MCV: 87.7 fl (ref 78.0–100.0)
Monocytes Absolute: 0.5 K/uL (ref 0.1–1.0)
Monocytes Relative: 4.4 % (ref 3.0–12.0)
Neutro Abs: 7.8 K/uL — ABNORMAL HIGH (ref 1.4–7.7)
Neutrophils Relative %: 71.9 % (ref 43.0–77.0)
Platelets: 409 K/uL — ABNORMAL HIGH (ref 150.0–400.0)
RBC: 4.58 Mil/uL (ref 3.87–5.11)
RDW: 13.6 % (ref 11.5–15.5)
WBC: 10.9 K/uL — ABNORMAL HIGH (ref 4.0–10.5)

## 2024-07-30 LAB — HEMOGLOBIN A1C: Hgb A1c MFr Bld: 7.4 % — ABNORMAL HIGH (ref 4.6–6.5)

## 2024-07-30 MED ORDER — ATORVASTATIN CALCIUM 40 MG PO TABS: ORAL_TABLET | ORAL | 3 refills | Status: AC

## 2024-07-30 MED ORDER — LISINOPRIL 10 MG PO TABS: ORAL_TABLET | ORAL | 3 refills | Status: AC

## 2024-07-30 MED ORDER — METFORMIN HCL ER 500 MG PO TB24
500.0000 mg | ORAL_TABLET | Freq: Every day | ORAL | 1 refills | Status: AC
Start: 2024-07-30 — End: ?

## 2024-07-30 NOTE — Assessment & Plan Note (Addendum)
 Immunizations are UTD.  Will add MMR titer per patient request.  She will obtain RSV vaccine from pharmacy.   I evaluated patient, was consulted regarding treatment, and agree with assessment and plan per Lanell Carpenter, MSN, FNP student.   Mallie Gaskins, NP-C

## 2024-07-30 NOTE — Assessment & Plan Note (Addendum)
 Controlled with Atorvastatin  40 mg. Holding medication for 2 weeks to see if pain in right leg and hip subside.   Repeat lipid panel, pending.  I evaluated patient, was consulted regarding treatment, and agree with assessment and plan per Leetta Hendriks, MSN, FNP student.   Mallie Gaskins, NP-C

## 2024-07-30 NOTE — Assessment & Plan Note (Addendum)
 Differentials include osteoarthritis vs statin side effects vs injury from fall.   Xray ordered, pending. Hold Atorvastatin  for 2 weeks to see if symptoms improve. Asked for an update in 2 weeks.   I evaluated patient, was consulted regarding treatment, and agree with assessment and plan per Christianjames Soule, MSN, FNP student.   Mallie Gaskins, NP-C

## 2024-07-30 NOTE — Patient Instructions (Signed)
 Lab and xray before leaving.  Hold Atorvastatin  for 2 weeks.  Receive RSV vaccination at pharmacy.

## 2024-07-30 NOTE — Progress Notes (Signed)
   Established Patient Office Visit  Subjective   Patient ID: Melissa Baldwin, female    DOB: Aug 17, 1956  Age: 68 y.o. MRN: [id number]  Chief Complaint  Patient presents with   Annual Exam    Fasting    HPI  Melissa Baldwin is a 68 year old female with a history of essential hypertension, Type 2 diabetes, and hyperlipidemia who presents today for follow up of chronic conditions.   Immunizations: -Pneumonia: Prevnar 20 in 2022 -Shingles: Completed series   Mammogram: Up to date Colonoscopy: Due in 2027 Dexa Scan: Completed June 2025   1.) Hypertension: Currently managed on lisinopril  10 mg daily. Patient denies headaches, dizziness, chest pain, visual changes or shortness of breath.   BP Readings from Last 3 Encounters:  07/30/24 136/70  01/30/24 134/76  12/01/23 128/62   2.) Diabetes: Currently managed on metformin  ER 500 mg ER. She denies checking blood glucose at home, states that she does not have the equipment. Denies any visual changes, numbness or tingling. States that she does have diarrhea on occasion.  Last A1c: 7.0 in February 2024, drawn today. Last Eye Exam: UTD Last Foot Exam: UTD Pneumonia Vaccination: 2002 Urine Microalbumin: UTD Statin: Atorvastatin   3.) Hyperlipidemia: Currently managed with Atorvastatin  40 mg tablet. She is due for repeat lipid panel today.  4.) Acute hip pain: Pulled muscle feel on right leg, unable to bend and having trouble putting socks and shoes on.  Fell 2 months ago, feet twisted up on vacuum hose. Hurt before. On and off, but in the past couple of weeks worse.  5.) Immunizations: Reports planned trip to [place] in January and needs verification of measles vaccination and would like to receive RSV vaccination.    Review of Systems  Constitutional: Negative.   HENT: Negative.    Eyes: Negative.   Respiratory: Negative.    Cardiovascular: Negative.   Gastrointestinal: Negative.   Musculoskeletal:  Positive for falls  and myalgias.  Skin: Negative.   Neurological: Negative.       Objective:     BP 136/70   Pulse 98   Temp 97.8 F (36.6 C) (Temporal)   Ht 5' 3 (1.6 m)   Wt 74.8 kg   SpO2 99%   BMI 29.23 kg/m    Physical Exam HENT:     Head: Normocephalic.     Nose: Nose normal.  Eyes:     Extraocular Movements: Extraocular movements intact.  Cardiovascular:     Rate and Rhythm: Normal rate.  Pulmonary:     Effort: Pulmonary effort is normal.  Abdominal:     General: Abdomen is flat. Bowel sounds are normal.     Palpations: Abdomen is soft.  Musculoskeletal:     Cervical back: Normal range of motion.     Right hip: Decreased range of motion.     Comments: Decrease in ROM with pain with right hip internal rotation.  Skin:    General: Skin is warm and dry.  Neurological:     General: No focal deficit present.     Mental Status: She is alert.      No results found for any visits on 07/30/24.    The 10-year ASCVD risk score (Arnett DK, et al., 2019) is: 20.4%    Assessment & Plan:   Problem List Items Addressed This Visit   None   No follow-ups on file.    Melissa Niven, RN

## 2024-07-30 NOTE — Progress Notes (Signed)
 Subjective:    Patient ID: Melissa Baldwin, female    DOB: [birth year], 68 y.o.   MRN: [id number] Discussed the use of AI scribe software for clinical note transcription with the patient, who gave verbal consent to proceed.  History of Present Illness    Melissa Baldwin is a very pleasant 68 y.o. female with a history of type 2 diabetes, hyperlipidemia, hypertension who presents today for follow up of chronic conditions.   Immunizations: -Shingles: Completed Shingrix  series -Pneumonia: Completed Prevnar 20 in 2022  Mammogram: Completed in July 2025 Bone Density Scan: Completed in June 2025  Colonoscopy: Completed in 2024, due 2027  1) Hypertension: Currently managed on lisinopril  10 mg daily. She denies chest pain, headaches, dizziness, visual changes, shortness.   BP Readings from Last 3 Encounters:  07/30/24 136/70  01/30/24 134/76  12/01/23 128/62   2) Type 2 Diabetes:  Current medications include: metformin  ER 500 mg daily  She is checking her blood glucose 0 times daily. She does not have a glucometer.   She denies numbness/tingling, visual changes.   Last A1C: 7.0 in February 2025,  Last Eye Exam: UTD Last Foot Exam: UTD Pneumonia Vaccination: 2022 Urine Microalbumin: UTD Statin: atorvastatin    3) Hyperlipidemia: Currently managed on atorvastatin . She does have a chronic muscle pulling sensation to the right lower extremity.  4) Hip Pain: Chronic right lower extremity muscle pulling. She fell 2 months ago, tripped on the vacuum cord, no head injury. Since then she's noticed increased pain to the medial thigh and right hip and right buttocks. Symptoms increased about two weeks ago which are now felt in the right buttocks and hip.     Review of Systems  Eyes:  Negative for visual disturbance.  Respiratory:  Negative for shortness of breath.   Cardiovascular:  Negative for chest pain.  Musculoskeletal:  Positive for arthralgias and myalgias.   Neurological:  Negative for dizziness and numbness.         Past Medical History:  Diagnosis Date   Diabetes mellitus without complication (HCC)    Hyperlipidemia    Hypertension    Seasonal allergies     Social History   Socioeconomic History   Marital status: Married    Spouse name: Not on file   Number of children: Not on file   Years of education: Not on file   Highest education level: Associate degree: occupational, Scientist, product/process development, or vocational program  Occupational History   Not on file  Tobacco Use   Smoking status: Never   Smokeless tobacco: Never  Vaping Use   Vaping status: Never Used  Substance and Sexual Activity   Alcohol use: Yes   Drug use: No   Sexual activity: Not on file  Other Topics Concern   Not on file  Social History Narrative   Married.   1 child.   Moved from [place] [facility] [place]   Works as a Sales executive.   Social Drivers of Corporate investment banker Strain: Low Risk  (07/27/2024)   Overall Financial Resource Strain (CARDIA)    Difficulty of Paying Living Expenses: Not hard at all  Food Insecurity: No Food Insecurity (07/27/2024)   Hunger Vital Sign    Worried About Running Out of Food in the Last Year: Never true    Ran Out of Food in the Last Year: Never true  Transportation Needs: No Transportation Needs (07/27/2024)   PRAPARE - Administrator, Civil Service (Medical): No    Lack of Transportation (  Non-Medical): No  Physical Activity: Insufficiently Active (07/27/2024)   Exercise Vital Sign    Days of Exercise per Week: 4 days    Minutes of Exercise per Session: 20 min  Stress: No Stress Concern Present (07/27/2024)   [employer] of Occupational Health - Occupational Stress Questionnaire    Feeling of Stress: Only a little  Social Connections: Socially Isolated (07/27/2024)   Social Connection and Isolation Panel    Frequency of Communication with Friends and Family: Once a week    Frequency of Social Gatherings with  Friends and Family: Never    Attends Religious Services: Never    Database administrator or Organizations: No    Attends Engineer, structural: Not on file    Marital Status: Married  Catering manager Violence: Not At Risk (12/01/2023)   Humiliation, Afraid, Rape, and Kick questionnaire    Fear of Current or Ex-Partner: No    Emotionally Abused: No    Physically Abused: No    Sexually Abused: No    Past Surgical History:  Procedure Laterality Date   BREAST BIOPSY Right    neg   BREAST CYST ASPIRATION Left    BREAST EXCISIONAL BIOPSY     COLONOSCOPY WITH PROPOFOL  N/A 04/27/2017   Procedure: COLONOSCOPY WITH PROPOFOL ;  Surgeon: Therisa Bi, MD;  Location: [facility] ENDOSCOPY;  Service: Endoscopy;  Laterality: N/A;   COLONOSCOPY WITH PROPOFOL  N/A 04/24/2023   Procedure: COLONOSCOPY WITH PROPOFOL ;  Surgeon: Therisa Bi, MD;  Location[facility] ENDOSCOPY;  Service: Gastroenterology;  Laterality: N/A;   MOHS SURGERY  06/2023   TONSILLECTOMY     UTERINE FIBROID SURGERY      Family History  Problem Relation Age of Onset   Alzheimer's disease Mother    Diabetes Paternal Grandfather    Breast cancer Neg Hx     Allergies  Allergen Reactions   Penicillins     Current Outpatient Medications on File Prior to Visit  Medication Sig Dispense Refill   loratadine (CLARITIN) 10 MG tablet Take 10 mg by mouth daily.     meloxicam (MOBIC) 7.5 MG tablet Take 7.5 mg by mouth daily. (Patient not taking: Reported on 07/30/2024)     No current facility-administered medications on file prior to visit.    BP 136/70   Pulse 98   Temp 97.8 F (36.6 C) (Temporal)   Ht 5' 3 (1.6 m)   Wt 165 lb (74.8 kg)   SpO2 99%   BMI 29.23 kg/m  Objective:   Physical Exam Cardiovascular:     Rate and Rhythm: Normal rate and regular rhythm.  Pulmonary:     Effort: Pulmonary effort is normal.     Breath sounds: Normal breath sounds.  Musculoskeletal:     Cervical back: Neck supple.     Right hip:  Decreased range of motion.     Comments: Decrease in ROM with pain with right internal rotation   Skin:    General: Skin is warm and dry.  Neurological:     Mental Status: She is alert and oriented to person, place, and time.  Psychiatric:        Mood and Affect: Mood normal.     Physical Exam        Assessment & Plan:  Type 2 diabetes mellitus with hyperglycemia, without long-term current use of insulin (HCC) Assessment & Plan: A1c pending  Continue taking Metformin  ER 500 mg once daily.  Follow up in 6 months.  I evaluated patient, was  consulted regarding treatment, and agree with assessment and plan per Kristin Rudd, MSN, FNP student.   Mallie Gaskins, NP-C  Orders: -     Hemoglobin A1c -     metFORMIN  HCl ER; Take 1 tablet (500 mg total) by mouth daily with breakfast. for diabetes.  Dispense: 90 tablet; Refill: 1  Hyperlipidemia, unspecified hyperlipidemia type Assessment & Plan: Controlled with Atorvastatin  40 mg. Holding medication for 2 weeks to see if pain in right leg and hip subside.   Repeat lipid panel, pending.  I evaluated patient, was consulted regarding treatment, and agree with assessment and plan per Kristin Rudd, MSN, FNP student.   Mallie Gaskins, NP-C  Orders: -     Lipid panel -     Atorvastatin  Calcium ; TAKE 1 TABLET(40 MG) BY MOUTH EVERY EVENING FOR CHOLESTEROL  Dispense: 90 tablet; Refill: 3  Essential hypertension Assessment & Plan: Controlled.   Continue Lisinopril  10 mg daily.  I evaluated patient, was consulted regarding treatment, and agree with assessment and plan per Kristin Rudd, MSN, FNP student.   Mallie Gaskins, NP-C   Orders: -     CBC with Differential/Platelet -     Comprehensive metabolic panel with GFR -     Lisinopril ; TAKE 1 TABLET BY MOUTH EVERY DAY FOR BLOOD PRESSURE  Dispense: 90 tablet; Refill: 3  Acute pain of right hip Assessment & Plan: Differentials include osteoarthritis vs statin side effects vs injury from  fall.   Xray ordered, pending. Hold Atorvastatin  for 2 weeks to see if symptoms improve. Asked for an update in 2 weeks.   I evaluated patient, was consulted regarding treatment, and agree with assessment and plan per Kristin Rudd, MSN, FNP student.   Mallie Gaskins, NP-C  Orders: -     DG HIP UNILAT W OR W/O PELVIS 2-3 VIEWS RIGHT  Immunity status testing Assessment & Plan: Immunizations are UTD.  Will add MMR titer per patient request.  She will obtain RSV vaccine from pharmacy.   I evaluated patient, was consulted regarding treatment, and agree with assessment and plan per Kristin Rudd, MSN, FNP student.   Mallie Gaskins, NP-C  Orders: -     Measles/Mumps/Rubella Immunity    Assessment and Plan Assessment & Plan         Comer MARLA Gaskins, NP

## 2024-07-30 NOTE — Assessment & Plan Note (Addendum)
 Controlled.   Continue Lisinopril  10 mg daily.  I evaluated patient, was consulted regarding treatment, and agree with assessment and plan per Leovanni Bjorkman, MSN, FNP student.   Mallie Gaskins, NP-C

## 2024-07-30 NOTE — Assessment & Plan Note (Addendum)
 A1c pending  Continue taking Metformin  ER 500 mg once daily.  Follow up in 6 months.  I evaluated patient, was consulted regarding treatment, and agree with assessment and plan per Nasiir Monts, MSN, FNP student.   Mallie Gaskins, NP-C

## 2024-07-31 ENCOUNTER — Ambulatory Visit: Payer: Self-pay | Admitting: Primary Care

## 2024-08-01 LAB — MEASLES/MUMPS/RUBELLA IMMUNITY
Mumps IgG: >300 [AU]/ml
Rubella: 2.19 {index}
Rubeola IgG: >300 [AU]/ml

## 2024-08-23 ENCOUNTER — Other Ambulatory Visit (HOSPITAL_BASED_OUTPATIENT_CLINIC_OR_DEPARTMENT_OTHER): Payer: Self-pay

## 2024-08-23 ENCOUNTER — Ambulatory Visit (INDEPENDENT_AMBULATORY_CARE_PROVIDER_SITE_OTHER): Admitting: Orthopaedic Surgery

## 2024-08-23 DIAGNOSIS — M1611 Unilateral primary osteoarthritis, right hip: Secondary | ICD-10-CM | POA: Diagnosis not present

## 2024-08-23 DIAGNOSIS — M25551 Pain in right hip: Secondary | ICD-10-CM

## 2024-08-23 MED ORDER — LIDOCAINE HCL 1 % IJ SOLN
4.0000 mL | INTRAMUSCULAR | Status: AC | PRN
  Administered 2024-08-23: 4 mL

## 2024-08-23 MED ORDER — TRIAMCINOLONE ACETONIDE 40 MG/ML IJ SUSP
80.0000 mg | INTRAMUSCULAR | Status: AC | PRN
  Administered 2024-08-23: 80 mg via INTRA_ARTICULAR

## 2024-08-23 NOTE — Addendum Note (Signed)
 Addended by: Delsin Copen L on: 08/23/2024 03:29 PM   Modules accepted: Level of Service

## 2024-08-23 NOTE — Progress Notes (Signed)
 Chief Complaint: Right hip pain     History of Present Illness:    Melissa Baldwin is a 68 y.o. female presents with ongoing severe right hip and groin pain for the last 6 months.  She has no known injury.  She did have a fall onto a hose 6 months prior and is also experiencing pain in the posterior aspect of the hip.  She has not had any previous physical therapy or injections.    PMH/PSH/Family History/Social History/Meds/Allergies:    Past Medical History:  Diagnosis Date  . Diabetes mellitus without complication (HCC)   . Hyperlipidemia   . Hypertension   . Seasonal allergies    Past Surgical History:  Procedure Laterality Date  . BREAST BIOPSY Right    neg  . BREAST CYST ASPIRATION Left   . BREAST EXCISIONAL BIOPSY    . COLONOSCOPY WITH PROPOFOL  N/A 04/27/2017   Procedure: COLONOSCOPY WITH PROPOFOL ;  Surgeon: Therisa Bi, MD;  Location: [facility] ENDOSCOPY;  Service: Endoscopy;  Laterality: N/A;  . COLONOSCOPY WITH PROPOFOL  N/A 04/24/2023   Procedure: COLONOSCOPY WITH PROPOFOL ;  Surgeon: Therisa Bi, MD;  Location: [facility] ENDOSCOPY;  Service: Gastroenterology;  Laterality: N/A;  . MOHS SURGERY  06/2023  . TONSILLECTOMY    . UTERINE FIBROID SURGERY     Social History   Socioeconomic History  . Marital status: Married    Spouse name: Not on file  . Number of children: Not on file  . Years of education: Not on file  . Highest education level: Associate degree: occupational, Scientist, product/process development, or vocational program  Occupational History  . Not on file  Tobacco Use  . Smoking status: Never  . Smokeless tobacco: Never  Vaping Use  . Vaping status: Never Used  Substance and Sexual Activity  . Alcohol use: Yes  . Drug use: No  . Sexual activity: Not on file  Other Topics Concern  . Not on file  Social History Narrative   Married.   1 child.   Moved from [facility]   Works as a Sales executive.   Social Drivers of Corporate investment banker Strain: Low Risk   (07/27/2024)   Overall Financial Resource Strain (CARDIA)   . Difficulty of Paying Living Expenses: Not hard at all  Food Insecurity: No Food Insecurity (07/27/2024)   Hunger Vital Sign   . Worried About Programme researcher, broadcasting/film/video in the Last Year: Never true   . Ran Out of Food in the Last Year: Never true  Transportation Needs: No Transportation Needs (07/27/2024)   PRAPARE - Transportation   . Lack of Transportation (Medical): No   . Lack of Transportation (Non-Medical): No  Physical Activity: Insufficiently Active (07/27/2024)   Exercise Vital Sign   . Days of Exercise per Week: 4 days   . Minutes of Exercise per Session: 20 min  Stress: No Stress Concern Present (07/27/2024)   [employer] of Occupational Health - Occupational Stress Questionnaire   . Feeling of Stress: Only a little  Social Connections: Socially Isolated (07/27/2024)   Social Connection and Isolation Panel   . Frequency of Communication with Friends and Family: Once a week   . Frequency of Social Gatherings with Friends and Family: Never   . Attends Religious Services: Never   . Active Member of Clubs or Organizations: No   . Attends Banker Meetings: Not on file   . Marital Status: Married   Family History  Problem Relation Age of Onset  .  Alzheimer's disease Mother   . Diabetes Paternal Grandfather   . Breast cancer Neg Hx    Allergies  Allergen Reactions  . Penicillins    Current Outpatient Medications  Medication Sig Dispense Refill  . atorvastatin  (LIPITOR) 40 MG tablet TAKE 1 TABLET(40 MG) BY MOUTH EVERY EVENING FOR CHOLESTEROL 90 tablet 3  . lisinopril  (ZESTRIL ) 10 MG tablet TAKE 1 TABLET BY MOUTH EVERY DAY FOR BLOOD PRESSURE 90 tablet 3  . loratadine (CLARITIN) 10 MG tablet Take 10 mg by mouth daily.    . meloxicam (MOBIC) 7.5 MG tablet Take 7.5 mg by mouth daily. (Patient not taking: Reported on 07/30/2024)    . metFORMIN  (GLUCOPHAGE -XR) 500 MG 24 hr tablet Take 1 tablet (500 mg total) by  mouth daily with breakfast. for diabetes. 90 tablet 1   No current facility-administered medications for this visit.   No results found.  Review of Systems:   A ROS was performed including pertinent positives and negatives as documented in the HPI.  Physical Exam :   Constitutional: NAD and appears stated age Neurological: Alert and oriented Psych: Appropriate affect and cooperative There were no vitals taken for this visit.   Comprehensive Musculoskeletal Exam:    Right hip with tenderness about the femoral acetabular joint and groin.  Positive FADIR maneuver.  This is improved with 30 degrees external rotation otherwise distal neurosensory exams intact with antalgic gait   Imaging:   Xray (4 views hip): Via right hip osteoarthritis    I personally reviewed and interpreted the radiographs.   Assessment and Plan:   68 y.o. female with severe right hip osteoarthritis.  I discussed treatment options with her today.  At this time she would like to begin with an ultrasound-guided injection of the right hip.  We did briefly discuss right hip arthroplasty  -Right hip ultrasound-guided injection provided after verbal consent obtained    Procedure Note  Patient: Melissa Baldwin             Date of Birth: 01-18-1956           MRN: [id number]             Visit Date: 08/23/2024  Procedures: Visit Diagnoses:  1. Acute pain of right hip     Large Joint Inj: R hip joint on 08/23/2024 3:28 PM Indications: pain Details: 22 G 3.5 in needle, ultrasound-guided anterolateral approach  Arthrogram: No  Medications: 4 mL lidocaine  1 %; 80 mg triamcinolone  acetonide 40 MG/ML Outcome: tolerated well, no immediate complications Procedure, treatment alternatives, risks and benefits explained, specific risks discussed. Consent was given by the patient. Immediately prior to procedure a time out was called to verify the correct patient, procedure, equipment, support staff and site/side  marked as required. Patient was prepped and draped in the usual sterile fashion.         I personally saw and evaluated the patient, and participated in the management and treatment plan.  Elspeth Parker, MD Attending Physician, Orthopedic Surgery  This document was dictated using Dragon voice recognition software. A reasonable attempt at proof reading has been made to minimize errors.

## 2024-08-28 ENCOUNTER — Other Ambulatory Visit (HOSPITAL_BASED_OUTPATIENT_CLINIC_OR_DEPARTMENT_OTHER): Payer: Self-pay | Admitting: Orthopaedic Surgery

## 2024-08-28 ENCOUNTER — Encounter (HOSPITAL_BASED_OUTPATIENT_CLINIC_OR_DEPARTMENT_OTHER): Payer: Self-pay | Admitting: Orthopaedic Surgery

## 2024-08-28 DIAGNOSIS — M25551 Pain in right hip: Secondary | ICD-10-CM

## 2024-09-26 ENCOUNTER — Encounter: Payer: Self-pay | Admitting: Orthopaedic Surgery

## 2024-09-26 ENCOUNTER — Ambulatory Visit: Admitting: Orthopaedic Surgery

## 2024-09-26 DIAGNOSIS — M1611 Unilateral primary osteoarthritis, right hip: Secondary | ICD-10-CM

## 2024-09-26 NOTE — Progress Notes (Signed)
 The patient is a 68 year old female I am seeing for the first time.  She was referred by my partner Dr. Genelle to evaluate and treat known severe arthritis of her right hip.  She has been dealing with this since about March of this year but has probably been sore for longer.  She obviously has a very high threshold for pain.  She does walk with a Trendelenburg gait.  She has had at least 1 steroid injection in the right hip.  She has seen her x-rays but she knows that we will go over them again today.  She is a diabetic with a hemoglobin A1c in the low 7 range.  She does let me know that she has a very long trip that scheduled in January states she would like to consider hip replacement not until February.  She is leaving going to have a steroid injection in early January and I think is reasonable to try 1 more to help her through her trip which will involve a lot of walking and a large amount of travel.  At this point though her right hip pain is daily and it is detrimentally affecting her mobility, her quality of life and her actives daily living.  Her husband has to help put her shoes and socks on on the right side only.  She does not walk with any assistive device.  On exam her left hip moves smoothly and fluidly.  Her right hip is quite stiff with pain on internal and external rotation.  Standing AP pelvis and lateral that right hip shows complete loss of joint space.  There is sclerotic change as well as cystic changes and bone-on-bone wear.  The left hip space is well-maintained.  We did go over these x-rays in detail.  We had a long and thorough discussion about hip replacement surgery.  She would like to wait until February.  I recommended she use a cane in her opposite hand.  We talked about the risks and benefits of surgery.  I showed her her x-rays and a hip replacement model and gave her handout about hip replacement surgery.  All question concerns were answered and addressed.  We will be in  touch with scheduling her for a right total hip arthroplasty in February 2026.  She is welcome to reach out for me to get a hold of her anytime to discuss things further.

## 2024-10-14 ENCOUNTER — Encounter: Payer: Self-pay | Admitting: Radiology

## 2024-12-03 ENCOUNTER — Other Ambulatory Visit: Payer: Self-pay | Admitting: Primary Care

## 2024-12-03 ENCOUNTER — Ambulatory Visit (INDEPENDENT_AMBULATORY_CARE_PROVIDER_SITE_OTHER): Payer: Medicare Other

## 2024-12-03 VITALS — Ht 63.0 in | Wt 165.0 lb

## 2024-12-03 DIAGNOSIS — Z0000 Encounter for general adult medical examination without abnormal findings: Secondary | ICD-10-CM

## 2024-12-03 DIAGNOSIS — M1611 Unilateral primary osteoarthritis, right hip: Secondary | ICD-10-CM

## 2024-12-03 MED ORDER — MELOXICAM 7.5 MG PO TABS: 7.5000 mg | ORAL_TABLET | Freq: Every day | ORAL | 0 refills | Status: AC | PRN

## 2024-12-03 NOTE — Patient Instructions (Signed)
 Melissa Baldwin,  Thank you for taking the time for your Medicare Wellness Visit. I appreciate your continued commitment to your health goals. Please review the care plan we discussed, and feel free to reach out if I can assist you further.  Please note that Annual Wellness Visits do not include a physical exam. Some assessments may be limited, especially if the visit was conducted virtually. If needed, we may recommend an in-person follow-up with your provider.  Ongoing Care Seeing your primary care provider every 3 to 6 months helps us  monitor your health and provide consistent, personalized care.   Referrals If a referral was made during today's visit and you haven't received any updates within two weeks, please contact the referred provider directly to check on the status.  Recommended Screenings:  Health Maintenance  Topic Date Due   Medicare Annual Wellness Visit  11/30/2024   Yearly kidney health urinalysis for diabetes  01/29/2025   Complete foot exam   01/29/2025   Hemoglobin A1C  01/30/2025   Eye exam for diabetics  05/28/2025   Yearly kidney function blood test for diabetes  07/30/2025   Colon Cancer Screening  04/23/2026   Breast Cancer Screening  06/26/2026   Pneumococcal Vaccine for age over 27  Completed   Flu Shot  Completed   Osteoporosis screening with Bone Density Scan  Completed   Hepatitis C Screening  Completed   Zoster (Shingles) Vaccine  Completed   Meningitis B Vaccine  Aged Out   DTaP/Tdap/Td vaccine  Discontinued   COVID-19 Vaccine  Discontinued       11/29/2024    9:35 AM  Advanced Directives  Does Patient Have a Medical Advance Directive? Yes  Type of Advance Directive Living will;Healthcare Power of Attorney  Does patient want to make changes to medical advance directive? No - Patient declined  Copy of Healthcare Power of Attorney in Chart? No - copy requested    Vision: Annual vision screenings are recommended for early detection of glaucoma,  cataracts, and diabetic retinopathy. These exams can also reveal signs of chronic conditions such as diabetes and high blood pressure.  Dental: Annual dental screenings help detect early signs of oral cancer, gum disease, and other conditions linked to overall health, including heart disease and diabetes.

## 2024-12-03 NOTE — Progress Notes (Signed)
 "  Chief Complaint  Patient presents with   Medicare Wellness     Subjective:   Melissa Baldwin is a 68 y.o. female who presents for a Medicare Annual Wellness Visit.  Visit info / Clinical Intake: Medicare Wellness Visit Type:: Subsequent Annual Wellness Visit Persons participating in visit and providing information:: patient Medicare Wellness Visit Mode:: Telephone If telephone:: video declined Since this visit was completed virtually, some vitals may be partially provided or unavailable. Missing vitals are due to the limitations of the virtual format.: Unable to obtain vitals - no equipment If Telephone or Video please confirm:: I connected with patient using audio/video enable telemedicine. I verified patient identity with two identifiers, discussed telehealth limitations, and patient agreed to proceed. Patient Location:: home Provider Location:: home office Interpreter Needed?: No Pre-visit prep was completed: yes AWV questionnaire completed by patient prior to visit?: yes Date:: 11/26/24 Living arrangements:: (Patient-Rptd) lives with spouse/significant other Patient's Overall Health Status Rating: (Patient-Rptd) good Typical amount of pain: (Patient-Rptd) some Does pain affect daily life?: (!) (Patient-Rptd) yes Are you currently prescribed opioids?: no  Dietary Habits and Nutritional Risks How many meals a day?: (Patient-Rptd) 3 Eats fruit and vegetables daily?: (Patient-Rptd) yes Most meals are obtained by: (Patient-Rptd) preparing own meals In the last 2 weeks, have you had any of the following?: none Diabetic:: (!) yes Any non-healing wounds?: no How often do you check your BS?: as needed Would you like to be referred to a Nutritionist or for Diabetic Management? : no  Functional Status Activities of Daily Living (to include ambulation/medication): (Patient-Rptd) Independent Ambulation: (Patient-Rptd) Independent Medication Administration: (Patient-Rptd)  Independent Home Management (perform basic housework or laundry): (Patient-Rptd) Independent Manage your own finances?: (Patient-Rptd) yes Primary transportation is: (Patient-Rptd) driving Concerns about vision?: no *vision screening is required for WTM* Concerns about hearing?: no  Fall Screening Falls in the past year?: (Patient-Rptd) 0 Number of falls in past year: 0 Was there an injury with Fall?: 0 Fall Risk Category Calculator: 0 Patient Fall Risk Level: Low Fall Risk  Fall Risk Patient at Risk for Falls Due to: No Fall Risks Fall risk Follow up: Falls evaluation completed; Education provided; Falls prevention discussed  Home and Transportation Safety: All rugs have non-skid backing?: (Patient-Rptd) yes All stairs or steps have railings?: (Patient-Rptd) yes Grab bars in the bathtub or shower?: (Patient-Rptd) yes Have non-skid surface in bathtub or shower?: (!) (Patient-Rptd) no Good home lighting?: (Patient-Rptd) yes Regular seat belt use?: (Patient-Rptd) yes Hospital stays in the last year:: (Patient-Rptd) no  Cognitive Assessment Difficulty concentrating, remembering, or making decisions? : (Patient-Rptd) no Will 6CIT or Mini Cog be Completed: yes What year is it?: 0 points What month is it?: 0 points Give patient an address phrase to remember (5 components): [address] [place]  About what time is it?: 0 points Count backwards from 20 to 1: 0 points Say the months of the year in reverse: 0 points Repeat the address phrase from earlier: 0 points 6 CIT Score: 0 points  Advance Directives (For Healthcare) Does Patient Have a Medical Advance Directive?: Yes Does patient want to make changes to medical advance directive?: No - Patient declined (UTD 10/11/24) Type of Advance Directive: Living will; Healthcare Power of Attorney Copy of Healthcare Power of Attorney in Chart?: No - copy requested Copy of Living Will in Chart?: No - copy  requested  Reviewed/Updated  Reviewed/Updated: Reviewed All (Medical, Surgical, Family, Medications, Allergies, Care Teams, Patient Goals)    Allergies (verified) Penicillins  Current Medications (verified) Outpatient Encounter Medications as of 12/03/2024  Medication Sig   atorvastatin  (LIPITOR) 40 MG tablet TAKE 1 TABLET(40 MG) BY MOUTH EVERY EVENING FOR CHOLESTEROL   lisinopril  (ZESTRIL ) 10 MG tablet TAKE 1 TABLET BY MOUTH EVERY DAY FOR BLOOD PRESSURE   loratadine (CLARITIN) 10 MG tablet Take 10 mg by mouth daily.   meloxicam  (MOBIC ) 7.5 MG tablet Take 7.5 mg by mouth daily.   metFORMIN  (GLUCOPHAGE -XR) 500 MG 24 hr tablet Take 1 tablet (500 mg total) by mouth daily with breakfast. for diabetes.   No facility-administered encounter medications on file as of 12/03/2024.    History: Past Medical History:  Diagnosis Date   Allergy 1960   Penicillin   Diabetes mellitus without complication (HCC)    Hyperlipidemia    Hypertension    Seasonal allergies    Past Surgical History:  Procedure Laterality Date   BREAST BIOPSY Right    neg   BREAST CYST ASPIRATION Left    BREAST EXCISIONAL BIOPSY     COLONOSCOPY WITH PROPOFOL  N/A 04/27/2017   Procedure: COLONOSCOPY WITH PROPOFOL ;  Surgeon: Therisa Bi, MD;  Location: [facility] ENDOSCOPY;  Service: Endoscopy;  Laterality: N/A;   COLONOSCOPY WITH PROPOFOL  N/A 04/24/2023   Procedure: COLONOSCOPY WITH PROPOFOL ;  Surgeon: Therisa Bi, MD;  Location: [facility] ENDOSCOPY;  Service: Gastroenterology;  Laterality: N/A;   MOHS SURGERY  06/2023   TONSILLECTOMY     UTERINE FIBROID SURGERY     Family History  Problem Relation Age of Onset   Alzheimer's disease Mother    Diabetes Paternal Grandfather    Breast cancer Neg Hx    Social History   Occupational History   Not on file  Tobacco Use   Smoking status: Never   Smokeless tobacco: Never  Vaping Use   Vaping status: Never Used  Substance and Sexual Activity   Alcohol use: Yes   Drug  use: No   Sexual activity: Not on file   Tobacco Counseling Counseling given: Not Answered  SDOH Screenings   Food Insecurity: No Food Insecurity (12/03/2024)  Housing: Low Risk (12/03/2024)  Transportation Needs: No Transportation Needs (12/03/2024)  Utilities: Not At Risk (12/03/2024)  Alcohol Screen: Low Risk (11/29/2024)  Depression (PHQ2-9): Low Risk (12/03/2024)  Financial Resource Strain: Low Risk (12/03/2024)  Physical Activity: Sufficiently Active (12/03/2024)  Social Connections: Moderately Isolated (12/03/2024)  Stress: No Stress Concern Present (12/03/2024)  Tobacco Use: Low Risk (12/03/2024)  Health Literacy: Adequate Health Literacy (12/03/2024)   See flowsheets for full screening details  Depression Screen PHQ 2 & 9 Depression Scale- Over the past 2 weeks, how often have you been bothered by any of the following problems? Little interest or pleasure in doing things: 0 Feeling down, depressed, or hopeless (PHQ Adolescent also includes...irritable): 0 PHQ-2 Total Score: 0     Goals Addressed             This Visit's Progress    Weight (lb) < 200 lb (90.7 kg)   165 lb (74.8 kg)    I would like to walk 1-2 miles daily with the dogs and use elleptical after hip surgery             Objective:    Today's Vitals   12/03/24 1010  Weight: 165 lb (74.8 kg)  Height: 5' 3 (1.6 m)   Body mass index is 29.23 kg/m.  Hearing/Vision screen No results found. Immunizations and Health Maintenance Health Maintenance  Topic Date Due   Medicare Annual Wellness (  AWV)  11/30/2024   Diabetic kidney evaluation - Urine ACR  01/29/2025   FOOT EXAM  01/29/2025   HEMOGLOBIN A1C  01/30/2025   OPHTHALMOLOGY EXAM  05/28/2025   Diabetic kidney evaluation - eGFR measurement  07/30/2025   Colonoscopy  04/23/2026   Mammogram  06/26/2026   Pneumococcal Vaccine: 50+ Years  Completed   Influenza Vaccine  Completed   Bone Density Scan  Completed   Hepatitis C Screening   Completed   Zoster Vaccines- Shingrix   Completed   Meningococcal B Vaccine  Aged Out   DTaP/Tdap/Td  Discontinued   COVID-19 Vaccine  Discontinued        Assessment/Plan:  This is a routine wellness examination for [place].  Patient Care Team: Gretta Comer POUR, NP as PCP - General (Internal Medicine) [facility]  I have personally reviewed and noted the following in the patients chart:   Medical and social history Use of alcohol, tobacco or illicit drugs  Current medications and supplements including opioid prescriptions. Functional ability and status Nutritional status Physical activity Advanced directives List of other physicians Hospitalizations, surgeries, and ER visits in previous 12 months Vitals Screenings to include cognitive, depression, and falls Referrals and appointments  No orders of the defined types were placed in this encounter.  In addition, I have reviewed and discussed with patient certain preventive protocols, quality metrics, and best practice recommendations. A written personalized care plan for preventive services as well as general preventive health recommendations were provided to patient.   Erminio LITTIE Saris, LPN   [id number]    After Visit Summary: (MyChart) Due to this being a telephonic visit, the after visit summary with patients personalized plan was offered to patient via MyChart   Nurse Notes: No voiced or noted concerns at this time Patient advised to keep follow-up appointment with PCP (Feb 2026)  *Pt does request/assure a refill of Meloxicam  (as she going out of the country) and out of medication in one week.* "

## 2024-12-13 ENCOUNTER — Other Ambulatory Visit (HOSPITAL_BASED_OUTPATIENT_CLINIC_OR_DEPARTMENT_OTHER): Payer: Self-pay

## 2024-12-13 ENCOUNTER — Ambulatory Visit (HOSPITAL_BASED_OUTPATIENT_CLINIC_OR_DEPARTMENT_OTHER): Admitting: Orthopaedic Surgery

## 2024-12-13 DIAGNOSIS — M25551 Pain in right hip: Secondary | ICD-10-CM

## 2024-12-13 DIAGNOSIS — M1611 Unilateral primary osteoarthritis, right hip: Secondary | ICD-10-CM

## 2024-12-13 MED ORDER — LIDOCAINE HCL 1 % IJ SOLN
4.0000 mL | INTRAMUSCULAR | Status: AC | PRN
  Administered 2024-12-13: 4 mL

## 2024-12-13 MED ORDER — TRIAMCINOLONE ACETONIDE 40 MG/ML IJ SUSP
80.0000 mg | INTRAMUSCULAR | Status: AC | PRN
  Administered 2024-12-13: 80 mg via INTRA_ARTICULAR

## 2024-12-13 MED ORDER — MELOXICAM 15 MG PO TABS: 15.0000 mg | ORAL_TABLET | Freq: Every day | ORAL | 0 refills | Status: AC

## 2024-12-13 NOTE — Progress Notes (Signed)
 "   Chief Complaint: Right hip pain     History of Present Illness:   12/13/2024: Presents today seeking a right hip ultrasound-guided injection.  Melissa Baldwin is a 69 y.o. female presents with ongoing severe right hip and groin pain for the last 6 months.  She has no known injury.  She did have a fall onto a hose 6 months prior and is also experiencing pain in the posterior aspect of the hip.  She has not had any previous physical therapy or injections.    PMH/PSH/Family History/Social History/Meds/Allergies:    Past Medical History:  Diagnosis Date   Allergy 1960   Penicillin   Diabetes mellitus without complication (HCC)    Hyperlipidemia    Hypertension    Seasonal allergies    Past Surgical History:  Procedure Laterality Date   BREAST BIOPSY Right    neg   BREAST CYST ASPIRATION Left    BREAST EXCISIONAL BIOPSY     COLONOSCOPY WITH PROPOFOL  N/A 04/27/2017   Procedure: COLONOSCOPY WITH PROPOFOL ;  Surgeon: Therisa Bi, MD;  Location: [facility] ENDOSCOPY;  Service: Endoscopy;  Laterality: N/A;   COLONOSCOPY WITH PROPOFOL  N/A 04/24/2023   Procedure: COLONOSCOPY WITH PROPOFOL ;  Surgeon: Therisa Bi, MD;  Location: [facility] ENDOSCOPY;  Service: Gastroenterology;  Laterality: N/A;   MOHS SURGERY  06/2023   TONSILLECTOMY     UTERINE FIBROID SURGERY     Social History   Socioeconomic History   Marital status: Married    Spouse name: Not on file   Number of children: Not on file   Years of education: Not on file   Highest education level: Associate degree: occupational, scientist, product/process development, or vocational program  Occupational History   Not on file  Tobacco Use   Smoking status: Never   Smokeless tobacco: Never  Vaping Use   Vaping status: Never Used  Substance and Sexual Activity   Alcohol use: Yes   Drug use: No   Sexual activity: Not on file  Other Topics Concern   Not on file  Social History Narrative   Married.   1 child.   Moved from [facility]    Works as a sales executive.   Social Drivers of Health   Tobacco Use: Low Risk (12/03/2024)   Patient History    Smoking Tobacco Use: Never    Smokeless Tobacco Use: Never    Passive Exposure: Not on file  Financial Resource Strain: Low Risk (12/03/2024)   Overall Financial Resource Strain (CARDIA)    Difficulty of Paying Living Expenses: Not hard at all  Food Insecurity: No Food Insecurity (12/03/2024)   Epic    Worried About Radiation Protection Practitioner of Food in the Last Year: Never true    Ran Out of Food in the Last Year: Never true  Transportation Needs: No Transportation Needs (12/03/2024)   Epic    Lack of Transportation (Medical): No    Lack of Transportation (Non-Medical): No  Physical Activity: Sufficiently Active (12/03/2024)   Exercise Vital Sign    Days of Exercise per Week: 3 days    Minutes of Exercise per Session: 50 min  Stress: No Stress Concern Present (12/03/2024)   [employer] of Occupational Health - Occupational Stress Questionnaire    Feeling of Stress: Only a little  Social Connections: Moderately Isolated (12/03/2024)   Social Connection and Isolation Panel    Frequency of Communication with Friends and Family: Once a week    Frequency of Social Gatherings with Friends and Family: Never  Attends Religious Services: More than 4 times per year    Active Member of Clubs or Organizations: No    Attends Banker Meetings: Never    Marital Status: Married  Depression (PHQ2-9): Low Risk (12/03/2024)   Depression (PHQ2-9)    PHQ-2 Score: 0  Alcohol Screen: Low Risk (11/29/2024)   Alcohol Screen    Last Alcohol Screening Score (AUDIT): 4  Housing: Low Risk (12/03/2024)   Epic    Unable to Pay for Housing in the Last Year: No    Number of Times Moved in the Last Year: 0    Homeless in the Last Year: No  Utilities: Not At Risk (12/03/2024)   Epic    Threatened with loss of utilities: No  Health Literacy: Adequate Health  Literacy (12/03/2024)   B1300 Health Literacy    Frequency of need for help with medical instructions: Never   Family History  Problem Relation Age of Onset   Alzheimer's disease Mother    Diabetes Paternal Grandfather    Breast cancer Neg Hx    Allergies  Allergen Reactions   Penicillins    Current Outpatient Medications  Medication Sig Dispense Refill   meloxicam  (MOBIC ) 15 MG tablet Take 1 tablet (15 mg total) by mouth daily. 30 tablet 0   atorvastatin  (LIPITOR) 40 MG tablet TAKE 1 TABLET(40 MG) BY MOUTH EVERY EVENING FOR CHOLESTEROL 90 tablet 3   lisinopril  (ZESTRIL ) 10 MG tablet TAKE 1 TABLET BY MOUTH EVERY DAY FOR BLOOD PRESSURE 90 tablet 3   loratadine (CLARITIN) 10 MG tablet Take 10 mg by mouth daily.     meloxicam  (MOBIC ) 7.5 MG tablet Take 1 tablet (7.5 mg total) by mouth daily as needed for pain. 90 tablet 0   metFORMIN  (GLUCOPHAGE -XR) 500 MG 24 hr tablet Take 1 tablet (500 mg total) by mouth daily with breakfast. for diabetes. 90 tablet 1   No current facility-administered medications for this visit.   No results found.  Review of Systems:   A ROS was performed including pertinent positives and negatives as documented in the HPI.  Physical Exam :   Constitutional: NAD and appears stated age Neurological: Alert and oriented Psych: Appropriate affect and cooperative There were no vitals taken for this visit.   Comprehensive Musculoskeletal Exam:    Right hip with tenderness about the femoral acetabular joint and groin.  Positive FADIR maneuver.  This is improved with 30 degrees external rotation otherwise distal neurosensory exams intact with antalgic gait   Imaging:   Xray (4 views hip): Via right hip osteoarthritis    I personally reviewed and interpreted the radiographs.   Assessment and Plan:   69 y.o. female with severe right hip osteoarthritis.  At this time she is seeking a right ultrasound-guided injection which I provided after consent  was obtained  -Right hip ultrasound-guided injection after verbal consent obtained    Procedure Note  Patient: Melissa Baldwin             Date of Birth: Dec 30, 1955           MRN: [id number]             Visit Date: 12/13/2024  Procedures: Visit Diagnoses:  1. Unilateral primary osteoarthritis, right hip     Large Joint Inj: R hip joint on 12/13/2024 1:01 PM Indications: pain Details: 22 G 3.5 in needle, ultrasound-guided anterolateral approach  Arthrogram: No  Medications: 4 mL lidocaine  1 %; 80 mg triamcinolone  acetonide 40 MG/ML Outcome: tolerated  well, no immediate complications Procedure, treatment alternatives, risks and benefits explained, specific risks discussed. Consent was given by the patient. Immediately prior to procedure a time out was called to verify the correct patient, procedure, equipment, support staff and site/side marked as required. Patient was prepped and draped in the usual sterile fashion.         I personally saw and evaluated the patient, and participated in the management and treatment plan.  Elspeth Parker, MD Attending Physician, Orthopedic Surgery  This document was dictated using Dragon voice recognition software. A reasonable attempt at proof reading has been made to minimize errors. "

## 2025-02-04 ENCOUNTER — Ambulatory Visit: Admitting: Primary Care

## 2025-02-24 ENCOUNTER — Encounter: Admitting: Orthopaedic Surgery

## 2025-02-24 ENCOUNTER — Encounter: Admitting: Physician Assistant
